# Patient Record
Sex: Female | Born: 1990 | Race: White | Hispanic: No | Marital: Married | Smoking: Never smoker
Health system: Southern US, Community
[De-identification: ages and names within clinical notes are randomized; demographics above are authoritative.]

## PROBLEM LIST (undated history)

## (undated) DIAGNOSIS — T7840XA Allergy, unspecified, initial encounter: Secondary | ICD-10-CM

## (undated) DIAGNOSIS — G8929 Other chronic pain: Secondary | ICD-10-CM

## (undated) DIAGNOSIS — F4310 Post-traumatic stress disorder, unspecified: Secondary | ICD-10-CM

## (undated) DIAGNOSIS — J45909 Unspecified asthma, uncomplicated: Secondary | ICD-10-CM

## (undated) DIAGNOSIS — B019 Varicella without complication: Secondary | ICD-10-CM

## (undated) DIAGNOSIS — F419 Anxiety disorder, unspecified: Secondary | ICD-10-CM

## (undated) HISTORY — DX: Varicella without complication: B01.9

## (undated) HISTORY — DX: Allergy, unspecified, initial encounter: T78.40XA

## (undated) HISTORY — PX: FRACTURE SURGERY: SHX138

## (undated) HISTORY — DX: Other chronic pain: G89.29

## (undated) HISTORY — PX: TONSILLECTOMY: SUR1361

## (undated) HISTORY — DX: Post-traumatic stress disorder, unspecified: F43.10

## (undated) HISTORY — DX: Unspecified asthma, uncomplicated: J45.909

## (undated) HISTORY — DX: Anxiety disorder, unspecified: F41.9

---
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
## 2010-11-21 ENCOUNTER — Emergency Department (HOSPITAL_COMMUNITY)
Admission: EM | Admit: 2010-11-21 | Discharge: 2010-11-21 | Disposition: A | Discharge status: Home / Self Care | Payer: 59 | Attending: Emergency Medicine | Admitting: Emergency Medicine

## 2010-11-21 ENCOUNTER — Emergency Department (HOSPITAL_COMMUNITY): Payer: 59

## 2010-11-21 DIAGNOSIS — M79609 Pain in unspecified limb: Secondary | ICD-10-CM | POA: Insufficient documentation

## 2010-11-21 DIAGNOSIS — R0789 Other chest pain: Secondary | ICD-10-CM | POA: Insufficient documentation

## 2010-11-21 DIAGNOSIS — R0609 Other forms of dyspnea: Secondary | ICD-10-CM | POA: Insufficient documentation

## 2010-11-21 DIAGNOSIS — R0989 Other specified symptoms and signs involving the circulatory and respiratory systems: Secondary | ICD-10-CM | POA: Insufficient documentation

## 2010-11-21 DIAGNOSIS — R002 Palpitations: Secondary | ICD-10-CM | POA: Insufficient documentation

## 2010-11-21 LAB — POCT I-STAT, CHEM 8
BUN: 10 mg/dL (ref 6–23)
Calcium, Ion: 1.21 mmol/L (ref 1.12–1.32)
Chloride: 104 mEq/L (ref 96–112)
Creatinine, Ser: 0.9 mg/dL (ref 0.4–1.2)
Sodium: 139 mEq/L (ref 135–145)
TCO2: 26 mmol/L (ref 0–100)

## 2010-11-21 LAB — D-DIMER, QUANTITATIVE: D-Dimer, Quant: <0.22 ug/mL-FEU (ref 0.00–0.48)

## 2012-01-01 ENCOUNTER — Ambulatory Visit (INDEPENDENT_AMBULATORY_CARE_PROVIDER_SITE_OTHER): Payer: 59 | Admitting: Family Medicine

## 2012-01-01 VITALS — BP 120/73 | HR 83 | Temp 98.2°F | Resp 16 | Ht 67.0 in | Wt 155.0 lb

## 2012-01-01 DIAGNOSIS — J019 Acute sinusitis, unspecified: Secondary | ICD-10-CM

## 2012-01-01 MED ORDER — FLUTICASONE PROPIONATE 50 MCG/ACT NA SUSP: 2.0000 | Freq: Every day | NASAL | Status: DC

## 2012-01-01 MED ORDER — AMOXICILLIN 875 MG PO TABS: 875.0000 mg | ORAL_TABLET | Freq: Two times a day (BID) | ORAL | Status: AC

## 2012-01-01 NOTE — Progress Notes (Signed)
  Subjective:    Patient ID: Ashley Bond, female    DOB: 13-Oct-1991, 21 y.o.   MRN: [id number]  HPI 21 yo female with URI symptoms for 4 days.  Head congestion, nasal drainage, PND, sore throa, slight cough, headache.  Decreased hearing in right ear.  Subjective fever Saturday. Tried nyquil and ibuprofen.   Congestion making it hard to sleep.    Review of Systems Negative except as per HPI     Objective:   Physical Exam  Constitutional: She appears well-developed. No distress.  HENT:  Right Ear: Tympanic membrane, external ear and ear canal normal. Tympanic membrane is not injected, not scarred, not perforated, not erythematous, not retracted and not bulging.  Left Ear: Tympanic membrane, external ear and ear canal normal. Tympanic membrane is not injected, not scarred, not perforated, not erythematous, not retracted and not bulging.  Nose: Mucosal edema present. No rhinorrhea. Right sinus exhibits no maxillary sinus tenderness and no frontal sinus tenderness. Left sinus exhibits maxillary sinus tenderness. Left sinus exhibits no frontal sinus tenderness.  Mouth/Throat: Uvula is midline, oropharynx is clear and moist and mucous membranes are normal. No oropharyngeal exudate or tonsillar abscesses.  Cardiovascular: Normal rate, regular rhythm, normal heart sounds and intact distal pulses.   No murmur heard. Pulmonary/Chest: Effort normal and breath sounds normal. No respiratory distress. She has no wheezes. She has no rales.  Lymphadenopathy:       Head (right side): No submandibular and no preauricular adenopathy present.       Head (left side): No submandibular and no preauricular adenopathy present.    She has cervical adenopathy.       Right cervical: No superficial cervical and no posterior cervical adenopathy present.      Left cervical: Superficial cervical adenopathy present. No posterior cervical adenopathy present.       Right: No supraclavicular adenopathy present.   Left: No supraclavicular adenopathy present.  Skin: Skin is warm and dry.          Assessment & Plan:  Sinus infection - amox and flonase

## 2012-04-15 ENCOUNTER — Ambulatory Visit (INDEPENDENT_AMBULATORY_CARE_PROVIDER_SITE_OTHER): Payer: 59 | Admitting: Internal Medicine

## 2012-04-15 VITALS — BP 128/86 | HR 74 | Temp 97.9°F | Resp 16 | Ht 65.75 in | Wt 157.8 lb

## 2012-04-15 DIAGNOSIS — L408 Other psoriasis: Secondary | ICD-10-CM

## 2012-04-15 DIAGNOSIS — R350 Frequency of micturition: Secondary | ICD-10-CM

## 2012-04-15 DIAGNOSIS — G4710 Hypersomnia, unspecified: Secondary | ICD-10-CM

## 2012-04-15 DIAGNOSIS — G4700 Insomnia, unspecified: Secondary | ICD-10-CM

## 2012-04-15 DIAGNOSIS — R5383 Other fatigue: Secondary | ICD-10-CM

## 2012-04-15 DIAGNOSIS — L409 Psoriasis, unspecified: Secondary | ICD-10-CM

## 2012-04-15 DIAGNOSIS — R5381 Other malaise: Secondary | ICD-10-CM

## 2012-04-15 LAB — POCT URINALYSIS DIPSTICK
Bilirubin, UA: NEGATIVE
Glucose, UA: NEGATIVE
Ketones, UA: NEGATIVE
Nitrite, UA: NEGATIVE
Protein, UA: NEGATIVE
Spec Grav, UA: <=1.005
Urobilinogen, UA: 0.2
pH, UA: 6.5

## 2012-04-15 LAB — POCT UA - MICROSCOPIC ONLY
Bacteria, U Microscopic: NEGATIVE
Casts, Ur, LPF, POC: NEGATIVE
Crystals, Ur, HPF, POC: NEGATIVE
Epithelial cells, urine per micros: NEGATIVE
Mucus, UA: NEGATIVE
Yeast, UA: NEGATIVE

## 2012-04-15 LAB — POCT CBC
Granulocyte percent: 58.9 %G (ref 37–80)
Lymph, poc: 2.7 (ref 0.6–3.4)
MCHC: 30.4 g/dL — AB (ref 31.8–35.4)
MID (cbc): 0.3 (ref 0–0.9)
MPV: 8.1 fL (ref 0–99.8)
POC Granulocyte: 4.3 (ref 2–6.9)
POC LYMPH PERCENT: 36.9 %L (ref 10–50)
POC MID %: 4.2 %M (ref 0–12)
Platelet Count, POC: 414 10*3/uL (ref 142–424)
RDW, POC: 13.7 %

## 2012-04-15 LAB — COMPREHENSIVE METABOLIC PANEL
ALT: 22 U/L (ref 0–35)
Alkaline Phosphatase: 67 U/L (ref 39–117)
CO2: 27 mEq/L (ref 19–32)
Creat: 0.69 mg/dL (ref 0.50–1.10)
Sodium: 139 mEq/L (ref 135–145)
Total Bilirubin: 0.3 mg/dL (ref 0.3–1.2)
Total Protein: 6.9 g/dL (ref 6.0–8.3)

## 2012-04-15 LAB — TSH: TSH: 0.338 u[IU]/mL — ABNORMAL LOW (ref 0.350–4.500)

## 2012-04-15 LAB — POCT SEDIMENTATION RATE: POCT SED RATE: 15 mm/hr (ref 0–22)

## 2012-04-15 LAB — T4, FREE: Free T4: 1.22 ng/dL (ref 0.80–1.80)

## 2012-04-15 MED ORDER — CLOBETASOL PROPIONATE 0.05 % EX SOLN: CUTANEOUS | Status: DC

## 2012-04-15 MED ORDER — FLUOCINONIDE-E 0.05 % EX CREA: TOPICAL_CREAM | Freq: Two times a day (BID) | CUTANEOUS | Status: AC

## 2012-04-15 NOTE — Progress Notes (Signed)
Subjective:    Patient ID: Ashley Bond, female    DOB: 13-Jun-1991, 21 y.o.   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 but still feels unrested when wakes/no snoring but can't reach her nose at night and wakes gasping for air/family history strong for sleep apnea/has been evaluated for allergies and put on Flonase and Zyrtec but so far this hasn't helped sleep/primary care and the research triangle He admits lots of anxiety and worrying about everything when she is stressed by academics/stays awake thinking about what she might have forgotten to do Panic attacks when younger No current daytime anxiety, social anxiety, depression  No dysuria urgency small void hematuria No vaginal discharge or dyspareunia Menses regular/Nutrigrain/1 partner 2-1/2 years without problems   Psoriasis-Long history/mild Diet good/exercise good    Review of SystemsBesides the present history she has ongoing problems with back pain related to exercise better muscular in nature and intermittent She had neck pain  during the school year it was probably related to caring heavy equipment GI symptoms negative NMBJ Lots of stiffness but no swollen joints Neuro neuro negative/Although history of chronic headaches elicited October 2013 She has exercise-induced asthma and significant seasonal allergens Uses melatonin and sleepy time tea without much success Complete physical exam October 2013 in chart    Objective:   Physical Exam  Vital signs stable except for 157 pounds In no acute distress HEENT clear including no thyromegaly Heart regular without murmur Lungs clear including forced expiration Abdomen soft  with no organomegaly or masses Extremities clear Straight-leg raise negative to 90/back nontender to palpation Neck good range of motion Skin with mild eczema changes over her elbows and left side of occiput Neuro intact      Results for orders placed in visit on 04/15/12  POCT UA - MICROSCOPIC ONLY      Component Value Range   WBC, Ur, HPF, POC 0-1     RBC, urine, microscopic 0-1     Bacteria, U Microscopic neg     Mucus, UA neg     Epithelial cells, urine per micros neg     Crystals, Ur, HPF, POC neg     Casts, Ur, LPF, POC neg     Yeast, UA neg    POCT URINALYSIS DIPSTICK      Component Value Range   Color, UA yellow     Clarity, UA hazy     Glucose, UA neg     Bilirubin, UA neg     Ketones, UA neg     Spec Grav, UA <=1.005     Blood, UA trace     pH, UA 6.5     Protein, UA neg     Urobilinogen, UA 0.2     Nitrite, UA neg     Leukocytes, UA Trace    POCT CBC      Component Value Range   WBC 7.3  4.6 - 10.2 K/uL   Lymph, poc 2.7  0.6 - 3.4   POC LYMPH PERCENT 36.9  10 - 50 %L   MID (cbc) 0.3  0 - 0.9   POC MID % 4.2  0 - 12 %M   POC Granulocyte 4.3  2 - 6.9   Granulocyte percent 58.9  37 -  80 %G   RBC 4.82  4.04 - 5.48 M/uL   Hemoglobin 13.3  12.2 - 16.2 g/dL   HCT, POC 40.9  81.1 - 47.9 %   MCV 90.9  80 - 97 fL   MCH, POC 27.6  27 - 31.2 pg   MCHC 30.4 (*) 31.8 - 35.4 g/dL   RDW, POC 91.4     Platelet Count, POC 414  142 - 424 K/uL   MPV 8.1  0 - 99.8 fL     Assessment & Plan:   1. Urinary frequency  POCT UA - Microscopic Only, POCT urinalysis dipstick, Urine culture  2. Psoriasis  fluocinonide-emollient (LIDEX-E) 0.05 % cream  3. Fatigue  POCT CBC, POCT SEDIMENTATION RATE, Comprehensive metabolic panel, TSH, T4, Free  4. Hypersomnolence  Ambulatory referral to Sleep Studies  5. Insomnia  Ambulatory referral to Sleep Studies   Meds ordered this encounter  Medications  .       . fluocinonide-emollient (LIDEX-E) 0.05 % cream    Sig: Apply  topically 2 (two) times daily.    Dispense:  30 g    Refill:  3  . clobetasol (TEMOVATE) 0.05 % external solution    Sig: Apply to scalp twice a day as needed    Dispense:  50 mL    Refill:  1   Notify labs

## 2012-04-17 ENCOUNTER — Telehealth: Payer: Self-pay

## 2012-04-17 ENCOUNTER — Encounter: Payer: Self-pay | Admitting: Internal Medicine

## 2012-04-17 NOTE — Telephone Encounter (Signed)
Please contact pt about lab resulsts [phone]

## 2012-04-17 NOTE — Telephone Encounter (Signed)
Spoke with patient and let her know that Dr. Merla Riches will review the lab results and then we will call her.  She understood and said that was fine.

## 2012-04-20 NOTE — Telephone Encounter (Signed)
Pt called wanting to know lab results. Please call back at  [phone]

## 2012-04-21 MED ORDER — AMOXICILLIN 500 MG PO CAPS: 500.0000 mg | ORAL_CAPSULE | Freq: Three times a day (TID) | ORAL | Status: AC

## 2012-04-21 NOTE — Telephone Encounter (Signed)
Labs were all ok except urine showed mild infection.  Was she treated with antibiotics.

## 2012-04-21 NOTE — Telephone Encounter (Signed)
LMOM TO CB 

## 2012-04-21 NOTE — Telephone Encounter (Signed)
lmom that rx was sent in

## 2012-04-21 NOTE — Telephone Encounter (Signed)
CAN YOU PLEASE REVIEW LABS

## 2012-04-21 NOTE — Telephone Encounter (Signed)
Prescription was sent to the pharmacy

## 2012-04-21 NOTE — Telephone Encounter (Signed)
ADVISED PT OF RESULTS AND SHE WAS NOT GIVEN AN ANTIBIOTIC BUT SHE WOULD LIKE TO HAVE ONE SENT IN.  PHARMACY [facility] [place] Ashley Bond

## 2012-05-25 ENCOUNTER — Encounter (HOSPITAL_COMMUNITY): Payer: Self-pay | Admitting: Emergency Medicine

## 2012-05-25 ENCOUNTER — Emergency Department (HOSPITAL_COMMUNITY)
Admission: EM | Admit: 2012-05-25 | Discharge: 2012-05-26 | Disposition: A | Discharge status: Home / Self Care | Payer: 59 | Attending: Emergency Medicine | Admitting: Emergency Medicine

## 2012-05-25 DIAGNOSIS — F17200 Nicotine dependence, unspecified, uncomplicated: Secondary | ICD-10-CM | POA: Insufficient documentation

## 2012-05-25 DIAGNOSIS — R450 Nervousness: Secondary | ICD-10-CM | POA: Insufficient documentation

## 2012-05-25 DIAGNOSIS — R5381 Other malaise: Secondary | ICD-10-CM | POA: Insufficient documentation

## 2012-05-25 DIAGNOSIS — R531 Weakness: Secondary | ICD-10-CM

## 2012-05-25 LAB — COMPREHENSIVE METABOLIC PANEL
ALT: 12 U/L (ref 0–35)
AST: 17 U/L (ref 0–37)
Albumin: 3.7 g/dL (ref 3.5–5.2)
Alkaline Phosphatase: 56 U/L (ref 39–117)
CO2: 26 mEq/L (ref 19–32)
Chloride: 100 mEq/L (ref 96–112)
Potassium: 3.6 mEq/L (ref 3.5–5.1)
Total Bilirubin: 0.2 mg/dL — ABNORMAL LOW (ref 0.3–1.2)

## 2012-05-25 LAB — CBC WITH DIFFERENTIAL/PLATELET
Basophils Absolute: 0 10*3/uL (ref 0.0–0.1)
Basophils Relative: 0 % (ref 0–1)
Hemoglobin: 11.6 g/dL — ABNORMAL LOW (ref 12.0–15.0)
Lymphocytes Relative: 44 % (ref 12–46)
MCHC: 34 g/dL (ref 30.0–36.0)
Neutro Abs: 2.9 10*3/uL (ref 1.7–7.7)
Neutrophils Relative %: 46 % (ref 43–77)
RDW: 12.4 % (ref 11.5–15.5)
WBC: 6.4 10*3/uL (ref 4.0–10.5)

## 2012-05-25 LAB — URINALYSIS, ROUTINE W REFLEX MICROSCOPIC
Glucose, UA: NEGATIVE mg/dL
Ketones, ur: NEGATIVE mg/dL
Leukocytes, UA: NEGATIVE
Nitrite: NEGATIVE
Protein, ur: NEGATIVE mg/dL
pH: 6 (ref 5.0–8.0)

## 2012-05-25 MED ORDER — ASPIRIN 81 MG PO CHEW
CHEWABLE_TABLET | ORAL | Status: AC
  Administered 2012-05-25: 324 mg
  Filled 2012-05-25: qty 4

## 2012-05-25 NOTE — ED Notes (Signed)
EKG printed and given to EDP Devoria Albe for review. No previous EKG available for comparison.

## 2012-05-25 NOTE — ED Notes (Signed)
Pt alert, arrives from home, c/o weakness, "shakiness", onset a few days ago, recently treated for UTI, resp even unlabored, skin pwd

## 2012-05-25 NOTE — ED Provider Notes (Signed)
History     CSN: [id number]  Arrival date & time 05/25/12  1901   First MD Initiated Contact with Patient 05/25/12 2048      Chief Complaint  Patient presents with  . Weakness    (Consider location/radiation/quality/duration/timing/severity/associated sxs/prior treatment) HPI  Patient here for feeling shaky for the past few days. She also states today she feels weak and feels like she is "jumping out of my skin" she reports she doesn't have anything to be nervous about. 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 She states that she has had problems in the past with not sleeping at night and she has cut out her caffeine which helped for a while. She states she has extreme nasal congestion and has lots of sneezing, and she has a mild sore throat. She denies fever, wheezing, diarrhea, nausea or vomiting. She states she feels short of breath that is because she cannot breathe through her nose at all. She relates she takes Benadryl sometimes at night for the past few months. She also took some Alka-Seltzer and drank a Dr. Reino Kent at night this week which seemed to make her feel more jittery. She relates she saw allergist many years ago and did allergy shots for short period time which did not seem to help. She has been evaluated before and was referred to get a sleep study study to evaluate her for sleep apnea which she did not followup with.  PCP none  History reviewed. No pertinent past medical history.  Past Surgical History  Procedure Date  . Tonsillectomy     No family history on file.  History  Substance Use Topics  . Smoking status: Current Some Day Smoker -- 1.0 packs/day    Types: Cigarettes  . Smokeless tobacco: Not on file  . 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    Allergies  Shellfish allergy; Pseudoephedrine hcl er; and Soy allergy  Home Medications   Current Outpatient Rx  Name Route Sig Dispense Refill  . CLOBETASOL PROPIONATE 0.05 % EX SOLN  Apply to scalp twice a day as needed 50 mL 1  . DIPHENHYDRAMINE HCL 25 MG PO CAPS Oral Take 25 mg by mouth every 6 (six) hours as needed.    Marland Kitchen EPINEPHRINE 0.3 MG/0.3ML IJ DEVI Intramuscular Inject 0.3 mg into the muscle once.    . ETONOGESTREL-ETHINYL ESTRADIOL 0.12-0.015 MG/24HR VA RING Vaginal Place 1 each vaginally every 28 (twenty-eight) days. Insert vaginally and leave in place for 3 consecutive weeks, then remove for 1 week.    Marland Kitchen FLUOCINONIDE-E 0.05 % EX CREA Topical Apply topically 2 (two) times daily. 30 g 3  . IBUPROFEN 200 MG PO TABS Oral Take 200 mg by mouth every 6 (six) hours as needed.    Marland Kitchen MONTELUKAST SODIUM 10 MG PO TABS Oral Take 10 mg by mouth at bedtime.      BP 119/75  Pulse 76  Temp 98.5 F (36.9 C) (Oral)  Resp 12  Ht 5\' 6"  (1.676 m)  Wt 155 lb (70.308 kg)  BMI 25.02 kg/m2  SpO2 99%  LMP 05/15/2012  Vital signs  normal   Orthostatic vital signs were normal   Physical Exam  Nursing note and vitals reviewed. Constitutional: She is oriented to person, place, and time. She appears well-developed and well-nourished.  Non-toxic appearance. She does not appear ill. No distress.  HENT:  Head: Normocephalic and atraumatic.  Right Ear: External ear normal.  Left Ear: External ear normal.  Nose: Nose normal. No mucosal edema or rhinorrhea.  Mouth/Throat: Oropharynx is clear and moist and mucous membranes are normal. No dental abscesses or uvula swelling.       Inspection of her nose shows marked diffuse erythema and bogginess bilaterally of her turbinates  Eyes: Conjunctivae and EOM are normal. Pupils are equal, round, and reactive to light.  Neck: Normal range of motion and full passive range of motion without pain. Neck supple.  No thyromegaly present.  Cardiovascular: Normal rate, regular rhythm, normal heart sounds and intact distal pulses.  Exam reveals no gallop and no friction rub.   No murmur heard. Pulmonary/Chest: Effort normal and breath sounds normal. No respiratory distress. She has no wheezes. She has no rhonchi. She has no rales. She exhibits no tenderness and no crepitus.  Abdominal: Soft. Normal appearance and bowel sounds are normal. She exhibits no distension. There is no tenderness. There is no rebound and no guarding.  Musculoskeletal: Normal range of motion. She exhibits no edema and no tenderness.       Moves all extremities well.   Neurological: She is alert and oriented to person, place, and time. She has normal strength. No cranial nerve deficit.  Skin: Skin is warm, dry and intact. No rash noted. No erythema. No pallor.  Psychiatric: She has a normal mood and affect. Her speech is normal and behavior is normal. Her mood appears not anxious.    ED Course  Procedures (including critical care time)   Patient received 1 L of IV fluids. She relates she's feeling better. Patient advised to followup with ENT to evaluate her nasal congestion and possible polyps, she also can followup at [facility] urgent care to get the results of her thyroid tests which were pending at time of discharge.   Results for orders placed during the hospital encounter of 05/25/12  URINALYSIS, ROUTINE W REFLEX MICROSCOPIC      Component Value Range   Color, Urine YELLOW  YELLOW   APPearance CLEAR  CLEAR   Specific Gravity, Urine 1.007  1.005 - 1.030   pH 6.0  5.0 - 8.0   Glucose, UA NEGATIVE  NEGATIVE mg/dL   Hgb urine dipstick NEGATIVE  NEGATIVE   Bilirubin Urine NEGATIVE  NEGATIVE   Ketones, ur NEGATIVE  NEGATIVE mg/dL   Protein, ur NEGATIVE  NEGATIVE mg/dL   Urobilinogen, UA 0.2  0.0 - 1.0 mg/dL   Nitrite NEGATIVE  NEGATIVE   Leukocytes, UA NEGATIVE  NEGATIVE  CBC WITH DIFFERENTIAL      Component Value Range     WBC 6.4  4.0 - 10.5 K/uL   RBC 4.11  3.87 - 5.11 MIL/uL   Hemoglobin 11.6 (*) 12.0 - 15.0 g/dL   HCT 16.1 (*) 09.6 - 04.5 %   MCV 83.0  78.0 - 100.0 fL   MCH 28.2  26.0 - 34.0 pg   MCHC 34.0  30.0 - 36.0 g/dL   RDW 40.9  81.1 - 91.4 %   Platelets 279  150 - 400 K/uL   Neutrophils Relative 46  43 - 77 %   Neutro Abs 2.9  1.7 -  7.7 K/uL   Lymphocytes Relative 44  12 - 46 %   Lymphs Abs 2.8  0.7 - 4.0 K/uL   Monocytes Relative 8  3 - 12 %   Monocytes Absolute 0.5  0.1 - 1.0 K/uL   Eosinophils Relative 2  0 - 5 %   Eosinophils Absolute 0.2  0.0 - 0.7 K/uL   Basophils Relative 0  0 - 1 %   Basophils Absolute 0.0  0.0 - 0.1 K/uL  COMPREHENSIVE METABOLIC PANEL      Component Value Range   Sodium 134 (*) 135 - 145 mEq/L   Potassium 3.6  3.5 - 5.1 mEq/L   Chloride 100  96 - 112 mEq/L   CO2 26  19 - 32 mEq/L   Glucose, Bld 100 (*) 70 - 99 mg/dL   BUN 11  6 - 23 mg/dL   Creatinine, Ser 1.61  0.50 - 1.10 mg/dL   Calcium 9.3  8.4 - 09.6 mg/dL   Total Protein 6.9  6.0 - 8.3 g/dL   Albumin 3.7  3.5 - 5.2 g/dL   AST 17  0 - 37 U/L   ALT 12  0 - 35 U/L   Alkaline Phosphatase 56  39 - 117 U/L   Total Bilirubin 0.2 (*) 0.3 - 1.2 mg/dL   GFR calc non Af Amer >90  >90 mL/min   GFR calc Af Amer >90  >90 mL/min   Laboratory interpretation all normal .   Date: 05/25/2012  Rate: 79  Rhythm: normal sinus rhythm  QRS Axis: normal  Intervals: normal  ST/T Wave abnormalities: nonspecific T wave changes  Conduction Disutrbances:none  Narrative Interpretation:   Old EKG Reviewed: none available     1. Weakness   2. Jittery     Plan discharge  Devoria Albe, MD, FACEP   MDM          Ward Givens, MD 05/25/12 [phone]

## 2012-06-19 ENCOUNTER — Encounter: Payer: Self-pay | Admitting: Family Medicine

## 2012-06-19 ENCOUNTER — Ambulatory Visit (INDEPENDENT_AMBULATORY_CARE_PROVIDER_SITE_OTHER): Payer: 59 | Admitting: Family Medicine

## 2012-06-19 VITALS — BP 117/74 | HR 80 | Temp 98.2°F | Ht 66.75 in | Wt 155.0 lb

## 2012-06-19 DIAGNOSIS — Z13220 Encounter for screening for lipoid disorders: Secondary | ICD-10-CM

## 2012-06-19 DIAGNOSIS — Z0000 Encounter for general adult medical examination without abnormal findings: Secondary | ICD-10-CM | POA: Insufficient documentation

## 2012-06-19 LAB — LIPID PANEL
HDL: 118.9 mg/dL (ref >39.00)
Total CHOL/HDL Ratio: 2
VLDL: 14.2 mg/dL (ref 0.0–40.0)

## 2012-06-19 NOTE — Patient Instructions (Addendum)
Follow up in 1 year or as needed We'll notify you of your lab results and make any changes if needed Think of [place] as your home base- we're here if you need [place]! Keep up the good work!  You look great! Good luck w/ school!!!

## 2012-06-19 NOTE — Assessment & Plan Note (Signed)
Pt's PE WNL.  Had labs done recently in ER.  Will check lipids and Vit D as this was not done.  UTD on GYN.  Anticipatory guidance provided.

## 2012-06-19 NOTE — Progress Notes (Signed)
  Subjective:    Patient ID: Ashley Bond, female    DOB: October 01, 1991, 21 y.o.   MRN: [id number]  HPI New to establish.  Has been local x3 yrs but no PCP- using UCs as needed.  GYN in [place].  Desires CPE, no concerns today.   Review of Systems Patient reports no vision/ hearing changes, adenopathy,fever, weight change,  persistant/recurrent hoarseness , swallowing issues, chest pain, palpitations, edema, persistant/recurrent cough, hemoptysis, dyspnea (rest/exertional/paroxysmal nocturnal), gastrointestinal bleeding (melena, rectal bleeding), abdominal pain, significant heartburn, bowel changes, GU symptoms (dysuria, hematuria, incontinence), Gyn symptoms (abnormal  bleeding, pain),  syncope, focal weakness, memory loss, numbness & tingling, skin/hair/nail changes, abnormal bruising or bleeding.     Objective:   Physical Exam General Appearance:    Alert, cooperative, no distress, appears stated age  Head:    Normocephalic, without obvious abnormality, atraumatic  Eyes:    PERRL, conjunctiva/corneas clear, EOM's intact, fundi    benign, both eyes  Ears:    Normal TM's and external ear canals, both ears  Nose:   Nares normal, septum midline, mucosa normal, no drainage    or sinus tenderness  Throat:   Lips, mucosa, and tongue normal; teeth and gums normal  Neck:   Supple, symmetrical, trachea midline, no adenopathy;    Thyroid: no enlargement/tenderness/nodules  Back:     Symmetric, no curvature, ROM normal, no CVA tenderness  Lungs:     Clear to auscultation bilaterally, respirations unlabored  Chest Wall:    No tenderness or deformity   Heart:    Regular rate and rhythm, S1 and S2 normal, no murmur, rub   or gallop  Breast Exam:    Deferred to GYN  Abdomen:     Soft, non-tender, bowel sounds active all four quadrants,    no masses, no organomegaly  Genitalia:    Deferred to GYN  Rectal:    Extremities:   Extremities normal, atraumatic, no cyanosis or edema  Pulses:   2+ and  symmetric all extremities  Skin:   Skin color, texture, turgor normal, no rashes or lesions  Lymph nodes:   Cervical, supraclavicular, and axillary nodes normal  Neurologic:   CNII-XII intact, normal strength, sensation and reflexes    throughout          Assessment & Plan:

## 2012-06-20 ENCOUNTER — Encounter: Payer: Self-pay | Admitting: *Deleted

## 2012-06-22 LAB — VITAMIN D 1,25 DIHYDROXY
Vitamin D 1, 25 (OH)2 Total: 61 pg/mL (ref 18–72)
Vitamin D3 1, 25 (OH)2: 61 pg/mL

## 2012-06-24 ENCOUNTER — Encounter: Payer: Self-pay | Admitting: *Deleted

## 2012-07-24 ENCOUNTER — Ambulatory Visit: Payer: 59 | Admitting: Family Medicine

## 2012-11-22 ENCOUNTER — Ambulatory Visit: Payer: 59 | Admitting: Family Medicine

## 2012-11-22 DIAGNOSIS — Z0289 Encounter for other administrative examinations: Secondary | ICD-10-CM

## 2012-11-27 ENCOUNTER — Ambulatory Visit (INDEPENDENT_AMBULATORY_CARE_PROVIDER_SITE_OTHER): Payer: 59 | Admitting: Physician Assistant

## 2012-11-27 VITALS — BP 125/79 | HR 84 | Temp 97.9°F | Resp 18 | Ht 66.5 in | Wt 166.0 lb

## 2012-11-27 DIAGNOSIS — R05 Cough: Secondary | ICD-10-CM

## 2012-11-27 DIAGNOSIS — R059 Cough, unspecified: Secondary | ICD-10-CM

## 2012-11-27 DIAGNOSIS — J309 Allergic rhinitis, unspecified: Secondary | ICD-10-CM

## 2012-11-27 DIAGNOSIS — J019 Acute sinusitis, unspecified: Secondary | ICD-10-CM

## 2012-11-27 MED ORDER — IPRATROPIUM BROMIDE 0.06 % NA SOLN: 2.0000 | Freq: Three times a day (TID) | NASAL | Status: DC

## 2012-11-27 MED ORDER — BENZONATATE 200 MG PO CAPS: 200.0000 mg | ORAL_CAPSULE | Freq: Two times a day (BID) | ORAL | Status: DC | PRN

## 2012-11-27 MED ORDER — AZELASTINE HCL 0.15 % NA SOLN: 2.0000 | Freq: Two times a day (BID) | NASAL | Status: DC

## 2012-11-27 MED ORDER — AMOXICILLIN-POT CLAVULANATE 875-125 MG PO TABS: 1.0000 | ORAL_TABLET | Freq: Two times a day (BID) | ORAL | Status: DC

## 2012-11-27 NOTE — Progress Notes (Signed)
Patient ID: Ashley Bond MRN: [id number], DOB: May 03, 1991, 22 y.o. Date of Encounter: 11/27/2012, 1:07 PM  Primary Physician: Neena Rhymes, MD  Chief Complaint:  Chief Complaint  Patient presents with  . Sinus Problem    congestion x 2 weeks    HPI: 22 y.o. year old female presents with a 2 week history of nasal congestion, post nasal drip, sinus pressure, and cough. Afebrile. No chills. Nasal congestion thick and green/yellow. Sinus pressure is the worst symptom. Cough is productive secondary to post nasal drip and not associated with time of day. Ears feel full, leading to sensation of muffled hearing. Has tried OTC cold preps without success. No GI complaints. Appetite normal. No recent antibiotics, recent travels, or sick contacts. No leg trauma, sedentary periods, or h/o cancer. Some tobacco use.   She also mentions a long standing history of allergies. She has previously tried Nasonex and Singulair without good success. She currently resides in an older apartment and uses the air filters they supply.   Past Medical History  Diagnosis Date  . Asthma   . Chicken pox   . Anxiety   . Allergy      Home Meds: Prior to Admission medications   Medication Sig Start Date End Date Taking? Authorizing Provider  clobetasol (TEMOVATE) 0.05 % external solution Apply to scalp twice a day as needed 04/15/12  Yes Tonye Pearson, MD  EPINEPHrine (EPIPEN) 0.3 mg/0.3 mL DEVI Inject 0.3 mg into the muscle once.   Yes Historical Provider, MD  etonogestrel-ethinyl estradiol (NUVARING) 0.12-0.015 MG/24HR vaginal ring Place 1 each vaginally every 28 (twenty-eight) days. Insert vaginally and leave in place for 3 consecutive weeks, then remove for 1 week.   Yes Historical Provider, MD  FLUoxetine (PROZAC) 10 MG capsule Take 10 mg by mouth daily.   Yes Historical Provider, MD  diphenhydrAMINE (BENADRYL) 25 mg capsule Take 25 mg by mouth every 6 (six) hours as needed.   No Historical Provider, MD   fluocinonide-emollient (LIDEX-E) 0.05 % cream Apply topically 2 (two) times daily. 04/15/12 04/15/13 No Tonye Pearson, MD  ibuprofen (ADVIL,MOTRIN) 200 MG tablet Take 200 mg by mouth every 6 (six) hours as needed.   No Historical Provider, MD  montelukast (SINGULAIR) 10 MG tablet Take 10 mg by mouth at bedtime.   No Historical Provider, MD    Allergies:  Allergies  Allergen Reactions  . Shellfish Allergy   . Pseudoephedrine Hcl Er   . Soy Allergy     History   Social History  . Marital Status: Single    Spouse Name: N/A    Number of Children: N/A  . Years of Education: N/A   Occupational History  . Not on file.   Social History Main Topics  . Smoking status: Current Some Day Smoker -- 1.00 packs/day    Types: Cigarettes  . Smokeless tobacco: Not on file  . Alcohol Use: No  . Drug Use: No  . Sexually Active: Yes    Birth Control/ Protection: Inserts   Other Topics Concern  . Not on file   Social History Narrative  . No narrative on file     Review of Systems: Constitutional: Positive for fatigue. Negative for chills, fever, night sweats or weight changes HEENT: see above Cardiovascular: negative for chest pain or palpitations Respiratory: Positive for cough. Negative for hemoptysis, wheezing, or shortness of breath Abdominal: negative for abdominal pain, nausea, vomiting or diarrhea Dermatological: negative for rash Neurologic: Positive for headache. Negative  for dizziness or vertigo   Physical Exam: Blood pressure 125/79, pulse 84, temperature 97.9 F (36.6 C), temperature source Oral, resp. rate 18, height 5' 6.5" (1.689 m), weight 166 lb (75.297 kg), last menstrual period 11/12/2012, SpO2 97.00%., Body mass index is 26.39 kg/(m^2). General: Well developed, well nourished, in no acute distress. Head: Normocephalic, atraumatic, eyes without discharge, sclera non-icteric, nares are congested. Bilateral auditory canals clear, TM's are without perforation, pearly  grey with reflective cone of light bilaterally. Serous effusion bilaterally behind TM's. Maxillary and frontal sinus TTP. Oral cavity moist, dentition normal. Posterior pharynx with post nasal drip and mild erythema. No peritonsillar abscess or tonsillar exudate. Uvula midline. Neck: Supple. No thyromegaly. Full ROM. No lymphadenopathy. Lungs: Clear bilaterally to auscultation without wheezes, rales, or rhonchi. Breathing is unlabored.  Heart: RRR with S1 S2. No murmurs, rubs, or gallops appreciated. Msk:  Strength and tone normal for age. Extremities: No clubbing or cyanosis. No edema. Neuro: Alert and oriented X 3. Moves all extremities spontaneously. CNII-XII grossly in tact. Psych:  Responds to questions appropriately with a normal affect.     ASSESSMENT AND PLAN:  22 y.o. female with sinusitis and history of allergic rhinitis  22 y.o. female with sinusitis 875/125 mg 1 po bid #20 no RF -Atrovent NS 0.06% 2 sprays each nare bid prn #1 no RF -Tessalon Perles 200 mg 1 po tid prn cough #30 no RF  -Mucinex -Tylenol/Motrin prn -Rest/fluids -RTC precautions -RTC 3-5 days if no improvement  2) History of allergic rhinitis -Astepro 0.15% 2 sprays each nare bid #30 mL RF 6 -Daily Zyrtec -One teaspoon of local honey daily -Cover for box spring and mattress -Use better air filters -Routine house cleaning   Signed, Eula Listen, PA-C 11/27/2012 1:07 PM

## 2013-01-09 ENCOUNTER — Other Ambulatory Visit: Payer: Self-pay | Admitting: Physician Assistant

## 2013-01-09 NOTE — Telephone Encounter (Signed)
PATIENT IS STILL HAVING THE SAME SYMPTOMS OF SINUS PRESSURE. WAS GIVEN AMOXICILLNA CLAVULNATE AND WANTS TO KNOW IF SHE SHOULD COME BACK IN OR CAN HER RX BE REFILLED. PATIENT STATES SHE HAS ALREADY CALLED HER PHARMACY AND THEY FAXED SOMETHING HERE. PATIENT USES [facility] [address]  BEST CALLBACK NUMBER: [phone]

## 2013-01-10 NOTE — Telephone Encounter (Signed)
LMOM that pt needs re-check before any more Rxs can be written.

## 2013-03-03 ENCOUNTER — Encounter: Payer: Self-pay | Admitting: Internal Medicine

## 2013-03-03 ENCOUNTER — Ambulatory Visit (INDEPENDENT_AMBULATORY_CARE_PROVIDER_SITE_OTHER): Payer: 59 | Admitting: Internal Medicine

## 2013-03-03 VITALS — BP 122/80 | HR 89 | Temp 98.2°F | Wt 180.0 lb

## 2013-03-03 DIAGNOSIS — J029 Acute pharyngitis, unspecified: Secondary | ICD-10-CM

## 2013-03-03 MED ORDER — FLUTICASONE PROPIONATE 50 MCG/ACT NA SUSP: NASAL | Status: DC

## 2013-03-03 MED ORDER — FLUOXETINE HCL 10 MG PO CAPS: ORAL_CAPSULE | ORAL | Status: DC

## 2013-03-03 NOTE — Progress Notes (Signed)
  Subjective:    Patient ID: Ashley Bond, female    DOB: 16-Jan-1991, 22 y.o.   MRN: [id number]  HPI  Symptoms began 03/01/13 as head congestion, sore throat, earache, malaise with arthralgias, and cough with scant sputum production. She also noted some wheezing. There was some  nasal discharge  she describes  as being white.  She does describe pain in the maxillary sinus area bilaterally.  She had some sneezing but this is  a intermittent issue.  She did take Mucinex D for the congestion w/o issue despite allergy history   Review of Systems  No significant frontal sinus pain, fever, chills, or sweats. No purulent nasal secretions.She had no associated dyspnea.  She recently graduated from [employer] [facility]; Dr. Bobby Rumpf had increased her fluoxetine to 30 mg daily. She is no longer able to get this through student health.       Objective:   Physical Exam General appearance:good health ;well nourished; no acute distress or increased work of breathing is present.  No  lymphadenopathy about the head, neck, or axilla noted.   Eyes: No conjunctival inflammation or lid edema is present.   Ears:  External ear exam shows no significant lesions or deformities.  Otoscopic examination reveals clear canals, tympanic membranes are intact bilaterally without bulging, retraction, inflammation or discharge.  Nose:  External nasal examination shows no deformity or inflammation. Jewelry on L.Nasal mucosa are pink and moist without lesions or exudates. No septal dislocation or deviation.No obstruction to airflow.   Oral exam: Dental hygiene is good; lips and gums are healthy appearing.There is no oropharyngeal erythema or exudate noted.   Neck:  No deformities, masses, or tenderness noted.   Supple with full range of motion without pain.   Heart:  Normal rate and regular rhythm. S1 and S2 normal without gallop, murmur, click, rub or other extra sounds.   Lungs:Chest clear to auscultation; no wheezes,  rhonchi,rales ,or rubs present.No increased work of breathing.    Extremities:  No cyanosis, edema, or clubbing  noted    Skin: Warm & dry          Assessment & Plan:  #1 acute upper respiratory symptoms without definitive rhinosinusitis symptoms being present  #2 cough with subjective wheezing. At this time no reactive airways is noted  Plan: See orders and recommendations

## 2013-03-03 NOTE — Patient Instructions (Addendum)
Plain Mucinex (NOT D) for thick secretions ;force NON dairy fluids .   Nasal cleansing in the shower as discussed with lather of mild shampoo.After 10 seconds wash off lather while  exhaling through nostrils. Make sure that all residual soap is removed to prevent irritation.  Fluticasone 1 spray in each nostril twice a day as needed. Use the "crossover" technique into opposite nostril spraying toward opposite ear @ 45 degree angle, not straight up into nostril.  Use a Neti pot daily only  as needed for significant sinus congestion; going from open side to congested side . Plain Allegra (NOT D )  160 daily , Loratidine 10 mg , OR Zyrtec 10 mg @ bedtime  as needed for itchy eyes & sneezing. Zicam Melts or Zinc lozenges ; vitamin C 2000 mg daily; & Echinacea for 4-7 days. Report fever, exudate("pus") or progressive pain.

## 2013-03-04 ENCOUNTER — Encounter: Payer: Self-pay | Admitting: Family Medicine

## 2013-03-05 ENCOUNTER — Encounter: Payer: Self-pay | Admitting: Family Medicine

## 2013-03-05 ENCOUNTER — Telehealth: Payer: Self-pay | Admitting: Family Medicine

## 2013-03-05 MED ORDER — CEFUROXIME AXETIL 500 MG PO TABS: 500.0000 mg | ORAL_TABLET | Freq: Two times a day (BID) | ORAL | Status: DC

## 2013-03-05 NOTE — Telephone Encounter (Signed)
Please verify his cefuroxime was called in as per message in My Chart

## 2013-03-05 NOTE — Telephone Encounter (Signed)
Pt seen by Dr Alwyn Ren- he is best to assess her sxs after doing the exam.  Will forward to him for advice

## 2013-03-05 NOTE — Telephone Encounter (Signed)
Per My chart message Rx sent to pharmacy for Cefuroxime 500 mg bid # 14 . And message sent advising Pt thru my chart and Pt verbally inform by call.

## 2013-03-05 NOTE — Telephone Encounter (Signed)
To MD for review     KP 

## 2013-03-05 NOTE — Telephone Encounter (Signed)
Patient Information:  Caller Name: Baani  Phone: [phone]  Patient: Eda Keys  Gender: Female  DOB: 12/03/1990  Age: 22 Years  PCP: Sheliah Hatch  Pregnant: No  Office Follow Up:  Does the office need to follow up with this patient?: Yes  Instructions For The Office: Requesting antibiotic be called into [facility] on [address]   Symptoms  Reason For Call & Symptoms: Seen in office on 03/03/13 and sx worse- having thick yellowish mucos, eye pressure, R ear pain, throat hurts and feeling chilled and having sweats. Requesting antibiotic.  Reviewed Health History In EMR: Yes  Reviewed Medications In EMR: Yes  Reviewed Allergies In EMR: Yes  Reviewed Surgeries / Procedures: Yes  Date of Onset of Symptoms: 03/01/2013  Treatments Tried: Muscinex , Saline and Flucanizole Nasal Spray. Ibuprofen 200 mgs PO q 6 hr, Claritin  Treatments Tried Worked: No  Any Fever: Yes  Fever Taken: Tactile  Fever Time Of Reading: 10:00:00  Fever Last Reading: N/A OB / GYN:  LMP: 02/10/2013  Guideline(s) Used:  Earache  Disposition Per Guideline:   See Today in Office  Reason For Disposition Reached:   All other earaches (Exceptions: earache lasting < 1 hour, and earache from air travel)  Advice Given:  Pain Medicines:  For pain relief, you can take either acetaminophen, ibuprofen, or naproxen.  They are over-the-counter (OTC) pain drugs. You can buy them at the drugstore.  Call Back If  Earache last more than 1 hour  High fever, severe headache, or stiff neck occurs  You become worse.  Patient Refused Recommendation:  Patient Requests Prescription  Requesting antibiotic be called into [facility] on [address].

## 2013-03-05 NOTE — Telephone Encounter (Signed)
Please advise.//AB/CMA 

## 2013-03-11 ENCOUNTER — Telehealth: Payer: Self-pay | Admitting: Family Medicine

## 2013-03-11 ENCOUNTER — Encounter: Payer: Self-pay | Admitting: Family Medicine

## 2013-03-11 NOTE — Telephone Encounter (Signed)
Patient Information:  Caller Name: Jaylie  Phone: [phone]  Patient: Ashley Bond, Ashley Bond  Gender: Female  DOB: 09-22-1991  Age: 22 Years  PCP: Sheliah Hatch  Pregnant: No  Office Follow Up:  Does the office need to follow up with this patient?: No  Instructions For The Office: N/A  RN Note:  Transferred to office for appt scheduling tomorrow due to schedule full for today.  Symptoms  Reason For Call & Symptoms: 03/03/13 seen in office diagnosed with sinus infection, placed on antibiotics.   Advised to call back if sxs persists after 3-4 days.  03/11/13 a lot of nasal congestion, very thick, unable to get secretions out, continues to be unable to hear out of right ear and pressure.  Afebrile.  Continues to take antibiotic and using Mucinex and Benedryl at night.  Reviewed Health History In EMR: Yes  Reviewed Medications In EMR: Yes  Reviewed Allergies In EMR: Yes  Reviewed Surgeries / Procedures: Yes  Date of Onset of Symptoms: 03/03/2013  Treatments Tried: Mucinex,Benedryl at night  Treatments Tried Worked: No OB / GYN:  LMP: 02/10/2013  Guideline(s) Used:  Sinus Pain and Congestion  Disposition Per Guideline:   See Today or Tomorrow in Office  Reason For Disposition Reached:   Sinus congestion (pressure, fullness) present > 10 days  Advice Given:  Call Back If:   Severe pain lasts longer than 2 hours after pain medicine  You become worse.  Patient Will Follow Care Advice:  YES

## 2013-03-11 NOTE — Telephone Encounter (Signed)
Pt has an appt on 5/28.

## 2013-03-12 ENCOUNTER — Encounter: Payer: Self-pay | Admitting: Family Medicine

## 2013-03-12 ENCOUNTER — Ambulatory Visit (INDEPENDENT_AMBULATORY_CARE_PROVIDER_SITE_OTHER): Payer: 59 | Admitting: Family Medicine

## 2013-03-12 VITALS — BP 116/70 | HR 90 | Temp 98.6°F | Ht 66.75 in | Wt 181.4 lb

## 2013-03-12 DIAGNOSIS — J309 Allergic rhinitis, unspecified: Secondary | ICD-10-CM | POA: Insufficient documentation

## 2013-03-12 NOTE — Assessment & Plan Note (Signed)
New.  Pt w/out evidence of current infxn.  Pt to start daily antihistamine.  Continue nasal steroid spray.  Short term decongestant prn.  Pt expressed understanding and is in agreement w/ plan.

## 2013-03-12 NOTE — Patient Instructions (Addendum)
This appears to be all allergy related Start Claritin or Zyrtec daily (over the counter) Continue the nasal steroid spray Add Mucinex DM to decrease and thin your congestion (~3 days) Drink plenty of fluids REST! Hang in there!

## 2013-03-12 NOTE — Progress Notes (Signed)
  Subjective:    Patient ID: Ashley Bond, female    DOB: 03-23-1991, 22 y.o.   MRN: [id number]  HPI URI- pt recently dx'd w/ sinus infxn and is to complete Ceftin today.  Reports R ear is still uncomfortable, having persistent nasal congestion w/ PND.  Using nasal steroid regularly.  Taking benadryl at night.  Using OTC tylenol allergy.  No fevers.  No cough.  No HA.  No sinus pain/pressure.   Review of Systems For ROS see HPI     Objective:   Physical Exam  Vitals reviewed. Constitutional: She appears well-developed and well-nourished. No distress.  HENT:  Head: Normocephalic and atraumatic.  Right Ear: Tympanic membrane normal.  Left Ear: Tympanic membrane normal.  Nose: Mucosal edema and rhinorrhea present. Right sinus exhibits no maxillary sinus tenderness and no frontal sinus tenderness. Left sinus exhibits no maxillary sinus tenderness and no frontal sinus tenderness.  Mouth/Throat: Mucous membranes are normal. Posterior oropharyngeal erythema (w/ PND) present.  Eyes: Conjunctivae and EOM are normal. Pupils are equal, round, and reactive to light.  Neck: Normal range of motion. Neck supple.  Cardiovascular: Normal rate, regular rhythm and normal heart sounds.   Pulmonary/Chest: Effort normal and breath sounds normal. No respiratory distress. She has no wheezes. She has no rales.  Lymphadenopathy:    She has no cervical adenopathy.       Assessment & Plan:

## 2013-04-01 ENCOUNTER — Other Ambulatory Visit: Payer: Self-pay | Admitting: Internal Medicine

## 2013-04-02 NOTE — Telephone Encounter (Signed)
Last refill:03-03-13 Last CPE:06-19-12 Please advise.//AB/CMA

## 2013-04-25 ENCOUNTER — Ambulatory Visit (INDEPENDENT_AMBULATORY_CARE_PROVIDER_SITE_OTHER): Payer: 59 | Admitting: Family Medicine

## 2013-04-25 ENCOUNTER — Encounter: Payer: Self-pay | Admitting: Family Medicine

## 2013-04-25 VITALS — BP 120/76 | HR 86 | Temp 98.4°F | Wt 183.6 lb

## 2013-04-25 DIAGNOSIS — R3915 Urgency of urination: Secondary | ICD-10-CM

## 2013-04-25 DIAGNOSIS — N390 Urinary tract infection, site not specified: Secondary | ICD-10-CM

## 2013-04-25 LAB — POCT URINALYSIS DIPSTICK
Nitrite: POSITIVE
Urobilinogen, UA: 0.2
pH: 7.5

## 2013-04-25 MED ORDER — CEPHALEXIN 500 MG PO CAPS: 500.0000 mg | ORAL_CAPSULE | Freq: Two times a day (BID) | ORAL | Status: AC

## 2013-04-25 NOTE — Assessment & Plan Note (Signed)
New.  Pt's sxs and PE consistent w/ infxn.  Start abx.  Reviewed supportive care and red flags that should prompt return.  Pt expressed understanding and is in agreement w/ plan.  

## 2013-04-25 NOTE — Patient Instructions (Addendum)
This is a UTI Start the keflex twice daily Drink plenty of fluids Consider starting cranberry extract Call with any questions or concerns Hang in there!!!

## 2013-04-25 NOTE — Progress Notes (Signed)
  Subjective:    Patient ID: Ashley Bond, female    DOB: 1991/02/05, 22 y.o.   MRN: [id number]  HPI UTI- sxs started ~1 week ago.  + dysuria, frequency, incomplete emptying, hesitancy.  + low back ache.  No fevers.  Mild nausea, no vomiting.   Review of Systems For ROS see HPI     Objective:   Physical Exam  Vitals reviewed. Constitutional: She appears well-developed and well-nourished. No distress.  Abdominal: Soft. She exhibits no distension. There is no tenderness (no suprapubic or CVA tenderness).          Assessment & Plan:

## 2013-04-28 LAB — URINE CULTURE: Colony Count: >=100000

## 2013-08-21 ENCOUNTER — Other Ambulatory Visit: Payer: Self-pay

## 2013-09-03 ENCOUNTER — Ambulatory Visit (INDEPENDENT_AMBULATORY_CARE_PROVIDER_SITE_OTHER): Payer: 59 | Admitting: Family Medicine

## 2013-09-03 ENCOUNTER — Encounter: Payer: Self-pay | Admitting: Family Medicine

## 2013-09-03 VITALS — BP 110/80 | HR 95 | Temp 98.5°F | Resp 16 | Wt 181.0 lb

## 2013-09-03 DIAGNOSIS — R1084 Generalized abdominal pain: Secondary | ICD-10-CM | POA: Insufficient documentation

## 2013-09-03 DIAGNOSIS — R109 Unspecified abdominal pain: Secondary | ICD-10-CM

## 2013-09-03 MED ORDER — DICYCLOMINE HCL 20 MG PO TABS: 20.0000 mg | ORAL_TABLET | Freq: Four times a day (QID) | ORAL | Status: DC

## 2013-09-03 NOTE — Assessment & Plan Note (Signed)
New.  Suspect this is IBS but will get labs to r/o infxn, biliary dysfxn, electrolyte abnormality.  Start bentyl.  Reviewed lifestyle modifications.  If no sxs improvement, will refer to GI.  Pt expressed understanding and is in agreement w/ plan.

## 2013-09-03 NOTE — Progress Notes (Signed)
  Subjective:    Patient ID: Ashley Bond, female    DOB: 1991-02-22, 22 y.o.   MRN: [id number]  HPI Pre visit review using our clinic review tool, if applicable. No additional management support is needed unless otherwise documented below in the visit note.  abd pain- 'sharp stabbing pain'.  sxs started 7-10 days ago.  Ashley Bond 'felt very bloated and uncomfortable'.  At times pain will double pt over and feel as if she can't stand upright.  No fevers.  No nausea.  No diarrhea.  + constipation x2 weeks.  Some improvement in pain w/ BM.  At times will have pain radiating up to R shoulder.   Review of Systems For ROS see HPI     Objective:   Physical Exam  Vitals reviewed. Constitutional: She is oriented to person, place, and time. She appears well-developed and well-nourished. No distress.  Cardiovascular: Normal rate, regular rhythm and normal heart sounds.   Abdominal: Soft. Bowel sounds are normal. She exhibits distension (mild). She exhibits no mass. There is tenderness (diffuse, mild abdominal TTP). There is no rebound and no guarding.  Neurological: She is alert and oriented to person, place, and time.  Skin: Skin is warm and dry.  Psychiatric: She has a normal mood and affect. Her behavior is normal.          Assessment & Plan:

## 2013-09-03 NOTE — Patient Instructions (Signed)
Follow up as needed We'll notify you of your lab results and make any changes if needed Start the Bentyl as needed for abdominal spasm Drink plenty of water, eat lots of fiber, get regular exercise Start Miralax as needed for constipation Call with any questions or concerns Hang in there! Happy Holidays!!!

## 2013-09-04 LAB — BASIC METABOLIC PANEL
BUN: 14 mg/dL (ref 6–23)
Calcium: 9.1 mg/dL (ref 8.4–10.5)
GFR: 108.5 mL/min (ref >60.00)
Glucose, Bld: 83 mg/dL (ref 70–99)
Sodium: 136 mEq/L (ref 135–145)

## 2013-09-04 LAB — CBC WITH DIFFERENTIAL/PLATELET
Basophils Absolute: 0 10*3/uL (ref 0.0–0.1)
Eosinophils Relative: 1.7 % (ref 0.0–5.0)
Lymphocytes Relative: 28.6 % (ref 12.0–46.0)
Monocytes Relative: 5.2 % (ref 3.0–12.0)
Platelets: 380 10*3/uL (ref 150.0–400.0)
RDW: 13.2 % (ref 11.5–14.6)
WBC: 7.5 10*3/uL (ref 4.5–10.5)

## 2013-09-04 LAB — HEPATIC FUNCTION PANEL
AST: 20 U/L (ref 0–37)
Alkaline Phosphatase: 66 U/L (ref 39–117)
Bilirubin, Direct: 0.1 mg/dL (ref 0.0–0.3)
Total Bilirubin: 0.5 mg/dL (ref 0.3–1.2)

## 2013-11-08 ENCOUNTER — Other Ambulatory Visit: Payer: Self-pay | Admitting: Family Medicine

## 2013-11-10 NOTE — Telephone Encounter (Signed)
Med filled.  

## 2013-12-06 ENCOUNTER — Encounter: Payer: Self-pay | Admitting: Family Medicine

## 2013-12-06 ENCOUNTER — Ambulatory Visit (INDEPENDENT_AMBULATORY_CARE_PROVIDER_SITE_OTHER): Payer: 59 | Admitting: Family Medicine

## 2013-12-06 VITALS — BP 124/80 | Temp 98.4°F | Wt 185.0 lb

## 2013-12-06 DIAGNOSIS — J019 Acute sinusitis, unspecified: Secondary | ICD-10-CM

## 2013-12-06 MED ORDER — AMOXICILLIN 875 MG PO TABS: 875.0000 mg | ORAL_TABLET | Freq: Two times a day (BID) | ORAL | Status: DC

## 2013-12-06 NOTE — Progress Notes (Signed)
OFFICE NOTE  12/06/2013  CC:  Chief Complaint  Patient presents with  . Sinusitis     HPI: Patient is a 23 y.o. Caucasian female who is here for cold symptoms.  About 2+ weeks of sinus congestion/mucous from nose, PND, some cough last couple of days, feeling run down. No fever.  Mucinex D, nyquil, tea--helpful somewhat. Flonase on med list but not taking.  Pertinent PMH:  Exercise induced bronchospasm Tonsillectomy  MEDS:  Outpatient Prescriptions Prior to Visit  Medication Sig Dispense Refill  . clobetasol (TEMOVATE) 0.05 % external solution Apply to scalp twice a day as needed  50 mL  1  . dicyclomine (BENTYL) 20 MG tablet Take 1 tablet (20 mg total) by mouth every 6 (six) hours.  60 tablet  1  . diphenhydrAMINE (BENADRYL) 25 mg capsule Take 25 mg by mouth every 6 (six) hours as needed.      Marland Kitchen EPINEPHrine (EPIPEN) 0.3 mg/0.3 mL DEVI Inject 0.3 mg into the muscle once.      . etonogestrel-ethinyl estradiol (NUVARING) 0.12-0.015 MG/24HR vaginal ring Place 1 each vaginally every 28 (twenty-eight) days. Insert vaginally and leave in place for 3 consecutive weeks, then remove for 1 week.      Marland Kitchen FLUoxetine (PROZAC) 10 MG capsule TAKE 3 CAPSULES BY MOUTH EVERY DAY  270 capsule  0  . fluticasone (FLONASE) 50 MCG/ACT nasal spray 1 spray in each nostril twice a day as needed. Use the "crossover" technique as discussed  16 g  2  . ibuprofen (ADVIL,MOTRIN) 200 MG tablet Take 200 mg by mouth every 6 (six) hours as needed.       No facility-administered medications prior to visit.    PE: Blood pressure 124/80, temperature 98.4 F (36.9 C), temperature source Oral, weight 185 lb (83.915 kg). VS: noted--normal. Gen: alert, NAD, NONTOXIC APPEARING. HEENT: eyes without injection, drainage, or swelling.  Ears: EACs clear, TMs with normal light reflex and landmarks.  Nose: Clear rhinorrhea, with some dried, crusty exudate adherent to mildly injected mucosa.  No purulent d/c.  No paranasal sinus  TTP.  No facial swelling.  Throat and mouth without focal lesion.  No pharyngial swelling, erythema, or exudate.   Neck: supple, no LAD.   LUNGS: CTA bilat, nonlabored resps.   CV: RRR, no m/r/g. EXT: no c/c/e SKIN: no rash    IMPRESSION AND PLAN:  Acute sinusitis. Nasal irrigation encouraged. Amoxil 875 bid x 10d.  An After Visit Summary was printed and given to the patient.  FOLLOW UP: prn

## 2013-12-06 NOTE — Progress Notes (Signed)
Pre visit review using our clinic review tool, if applicable. No additional management support is needed unless otherwise documented below in the visit note. 

## 2014-03-18 ENCOUNTER — Encounter: Payer: Self-pay | Admitting: Internal Medicine

## 2014-03-18 ENCOUNTER — Ambulatory Visit (INDEPENDENT_AMBULATORY_CARE_PROVIDER_SITE_OTHER): Payer: 59 | Admitting: Internal Medicine

## 2014-03-18 VITALS — BP 110/70 | HR 89 | Temp 98.2°F | Ht 66.5 in | Wt 182.6 lb

## 2014-03-18 DIAGNOSIS — J0100 Acute maxillary sinusitis, unspecified: Secondary | ICD-10-CM

## 2014-03-18 MED ORDER — AMOXICILLIN 875 MG PO TABS: 875.0000 mg | ORAL_TABLET | Freq: Two times a day (BID) | ORAL | Status: DC

## 2014-03-18 NOTE — Progress Notes (Signed)
   Subjective:    Patient ID: Ashley Bond, female    DOB: Jul 15, 1991, 23 y.o.   MRN: [id number]  HPI  Symptoms began 03/15/14 as a sore throat, rhinitis, and fatigue.  She describes both frontal and maxillary sinus area pain. She has yellow nasal discharge which is insignificant in quantity. Cough is productive of clear scant secretions  One factor may have been a component of seasonal allergen exposure.  Symptoms are progressing and now associated with cough, tightness of chest, otic fullness and discomfort and myalgias/arthralgias. She's had some sneezing as well as some wheezing.  She has ongoing rhinitis and watery eyes.  Mucinex D helped but interfered with sleep. She's also used Benadryl the last 2 nights  She's used nonsteroidals and saline spray. NyQuil severe cold was not helpful  She has a history of reactive airways disease with exercise and seasonal allergens.    Review of Systems  She is not having fever, chills, or sweats. No SOB or wheezing despite PMH.     Objective:   Physical Exam  General appearance:good health ;well nourished; no acute distress or increased work of breathing is present.  No  lymphadenopathy about the head, neck, or axilla noted.   Eyes: No conjunctival inflammation or lid edema is present. There is no scleral icterus.  Ears:  External ear exam shows no significant lesions or deformities.  Otoscopic examination reveals clear canals, tympanic membranes are intact bilaterally without bulging, retraction, inflammation or discharge.  Nose:  External nasal examination shows no deformity or inflammation. Post L nare.Nasal mucosa are dry without lesions or exudates. No septal dislocation or deviation.No obstruction to airflow.   Oral exam: Dental hygiene is good; lips and gums are healthy appearing.There is no oropharyngeal erythema or exudate noted.   Neck:  No deformities, thyromegaly, masses, or tenderness noted.   Supple with full range of  motion without pain.   Heart:  Normal rate and regular rhythm. S1 and S2 normal without gallop, murmur, click, rub or other extra sounds.   Lungs:Chest clear to auscultation; no wheezes, rhonchi,rales ,or rubs present.No increased work of breathing.    Extremities:  No cyanosis, edema, or clubbing  noted    Skin: Warm & dry w/o jaundice or tenting.        Assessment & Plan:  #1 rhinosinusitis without significant bronchitis  Plan: Nasal hygiene interventions discussed. See prescription medications

## 2014-03-18 NOTE — Patient Instructions (Signed)

## 2014-03-18 NOTE — Progress Notes (Signed)
Pre visit review using our clinic review tool, if applicable. No additional management support is needed unless otherwise documented below in the visit note. 

## 2014-03-19 ENCOUNTER — Encounter: Payer: Self-pay | Admitting: Internal Medicine

## 2014-06-15 ENCOUNTER — Ambulatory Visit (INDEPENDENT_AMBULATORY_CARE_PROVIDER_SITE_OTHER): Payer: 59 | Admitting: Physician Assistant

## 2014-06-15 ENCOUNTER — Encounter: Payer: Self-pay | Admitting: Physician Assistant

## 2014-06-15 VITALS — BP 120/80 | HR 93 | Temp 98.0°F | Wt 178.0 lb

## 2014-06-15 DIAGNOSIS — R202 Paresthesia of skin: Secondary | ICD-10-CM

## 2014-06-15 DIAGNOSIS — R209 Unspecified disturbances of skin sensation: Secondary | ICD-10-CM

## 2014-06-15 DIAGNOSIS — R200 Anesthesia of skin: Secondary | ICD-10-CM | POA: Insufficient documentation

## 2014-06-15 DIAGNOSIS — M25549 Pain in joints of unspecified hand: Secondary | ICD-10-CM

## 2014-06-15 DIAGNOSIS — M2560 Stiffness of unspecified joint, not elsewhere classified: Secondary | ICD-10-CM | POA: Insufficient documentation

## 2014-06-15 DIAGNOSIS — M79643 Pain in unspecified hand: Secondary | ICD-10-CM

## 2014-06-15 MED ORDER — NAPROXEN SODIUM 550 MG PO TABS: 550.0000 mg | ORAL_TABLET | Freq: Two times a day (BID) | ORAL | Status: DC

## 2014-06-15 NOTE — Progress Notes (Signed)
Pre visit review using our clinic review tool, if applicable. No additional management support is needed unless otherwise documented below in the visit note. 

## 2014-06-15 NOTE — Patient Instructions (Signed)
Please take Anaprox twice daily as directed with food over the next week.  You can then reduce to one tablet daily.  Wear supportive shoes.  Apply topical Aspercreme or Icy Hot to hands to help with pain/stiffness.  I will call you with your results.  We will know more once results are in.

## 2014-06-15 NOTE — Assessment & Plan Note (Signed)
Sensory exam within normal limits.  Will check BMP and B12 levels.

## 2014-06-15 NOTE — Assessment & Plan Note (Signed)
Will obtain ESR, RF and Anti-CCP.  Discussed ROM exercises.  Rx anaprox for pain to take with food. 

## 2014-06-15 NOTE — Progress Notes (Signed)
Patient presents to clinic today c/o stiffness of hands bilaterally with R>L that last > 1 hour in the morning.  Symptoms have been present over the past several months.  Worsened over the past 1-2 months.  Endorses aching pain of joints.  Denies trauma or injury.  Denies decreased ROM.  Denies numbness or tingling of hands.  Patent does endorse intermittent tingling and numbness of feet bilaterally.  Denies hx of diabetes or neuropathic symptoms.  Does not smoke. Again, denies trauma or injury.  Past Medical History  Diagnosis Date  . Chicken pox   . Anxiety   . Allergy   . Asthma     exercise induced    Current Outpatient Prescriptions on File Prior to Visit  Medication Sig Dispense Refill  . diphenhydrAMINE (BENADRYL) 25 mg capsule Take 25 mg by mouth every 6 (six) hours as needed.      Marland Kitchen EPINEPHrine (EPIPEN) 0.3 mg/0.3 mL DEVI Inject 0.3 mg into the muscle once.      . etonogestrel-ethinyl estradiol (NUVARING) 0.12-0.015 MG/24HR vaginal ring Place 1 each vaginally every 28 (twenty-eight) days. Insert vaginally and leave in place for 3 consecutive weeks, then remove for 1 week.      Marland Kitchen FLUoxetine (PROZAC) 10 MG capsule TAKE 3 CAPSULES BY MOUTH EVERY DAY  270 capsule  0  . amoxicillin (AMOXIL) 875 MG tablet Take 1 tablet (875 mg total) by mouth 2 (two) times daily.  20 tablet  0  . clobetasol (TEMOVATE) 0.05 % external solution Apply to scalp twice a day as needed  50 mL  1  . dicyclomine (BENTYL) 20 MG tablet Take 1 tablet (20 mg total) by mouth every 6 (six) hours.  60 tablet  1  . fluticasone (FLONASE) 50 MCG/ACT nasal spray 1 spray in each nostril twice a day as needed. Use the "crossover" technique as discussed  16 g  2  . ibuprofen (ADVIL,MOTRIN) 200 MG tablet Take 200 mg by mouth every 6 (six) hours as needed.       No current facility-administered medications on file prior to visit.    Allergies  Allergen Reactions  . Shellfish Allergy   . Pseudoephedrine Hcl Er   . Soy  Allergy     Family History  Problem Relation Age of Onset  . Hyperlipidemia Mother   . Polycystic ovary syndrome Mother   . Breast cancer Maternal Aunt   . Breast cancer Maternal Grandmother   . Cancer Maternal Grandmother     breast  . Breast cancer Paternal Grandmother   . Arthritis Paternal Grandfather   . Cancer Paternal Grandfather     breast  . Heart disease Father   . Sleep apnea Father   . Sleep apnea Brother     History   Social History  . Marital Status: Single    Spouse Name: N/A    Number of Children: N/A  . Years of Education: N/A   Social History Main Topics  . Smoking status: Current Some Day Smoker    Types: Cigarettes  . Smokeless tobacco: None     Comment: 1/2 PACK X 1 WEEK   . Alcohol Use: Yes     Comment: A COUPLE DRINKS NIGHTLY   . Drug Use: No  . Sexual Activity: Yes    Birth Control/ Protection: Inserts   Other Topics Concern  . None   Social History Narrative  . None   Review of Systems - See HPI.  All other ROS are negative.  BP 120/80  Pulse 93  Temp(Src) 98 F (36.7 C)  Wt 178 lb (80.74 kg)  SpO2 98%  Physical Exam  Vitals reviewed. Constitutional: She is oriented to person, place, and time and well-developed, well-nourished, and in no distress.  HENT:  Head: Normocephalic and atraumatic.  Eyes: Conjunctivae are normal.  Neck: Neck supple.  Cardiovascular: Normal rate, regular rhythm, normal heart sounds and intact distal pulses.   Pulmonary/Chest: Effort normal and breath sounds normal.  Musculoskeletal:       Right hand: She exhibits tenderness. She exhibits normal range of motion, no bony tenderness and normal two-point discrimination. Normal sensation noted. Normal strength noted.       Left hand: She exhibits tenderness. She exhibits normal range of motion, no bony tenderness and normal two-point discrimination. Normal sensation noted. Normal strength noted.       Right foot: She exhibits normal range of motion, no  tenderness and no swelling.       Left foot: She exhibits normal range of motion, no tenderness and no swelling.  Neurological: She is alert and oriented to person, place, and time. She has normal sensation, normal strength and normal reflexes.  Skin: Skin is warm and dry. No rash noted.  Psychiatric: Affect normal.   Assessment/Plan: Joint stiffness Will obtain ESR, RF and Anti-CCP.  Discussed ROM exercises.  Rx anaprox for pain to take with food.  Numbness and tingling of both legs Sensory exam within normal limits.  Will check BMP and B12 levels.

## 2014-06-16 LAB — VITAMIN B12: Vitamin B-12: 345 pg/mL (ref 211–911)

## 2014-06-16 LAB — BASIC METABOLIC PANEL
BUN: 14 mg/dL (ref 6–23)
CALCIUM: 9.8 mg/dL (ref 8.4–10.5)
CO2: 29 mEq/L (ref 19–32)
CREATININE: 0.8 mg/dL (ref 0.4–1.2)
Chloride: 101 mEq/L (ref 96–112)
GFR: 98.06 mL/min (ref >60.00)
GLUCOSE: 99 mg/dL (ref 70–99)
Potassium: 3.9 mEq/L (ref 3.5–5.1)
Sodium: 138 mEq/L (ref 135–145)

## 2014-06-16 LAB — SEDIMENTATION RATE: Sed Rate: 11 mm/hr (ref 0–22)

## 2014-06-16 LAB — RHEUMATOID FACTOR: RHEUMATOID FACTOR: 13 [IU]/mL (ref <=14)

## 2014-06-16 LAB — CYCLIC CITRUL PEPTIDE ANTIBODY, IGG

## 2014-07-31 ENCOUNTER — Encounter: Payer: Self-pay | Admitting: Physician Assistant

## 2014-07-31 ENCOUNTER — Ambulatory Visit (INDEPENDENT_AMBULATORY_CARE_PROVIDER_SITE_OTHER): Payer: 59 | Admitting: Physician Assistant

## 2014-07-31 VITALS — BP 128/78 | HR 82 | Temp 98.5°F | Resp 16 | Ht 66.5 in | Wt 177.5 lb

## 2014-07-31 DIAGNOSIS — R399 Unspecified symptoms and signs involving the genitourinary system: Secondary | ICD-10-CM

## 2014-07-31 DIAGNOSIS — G5603 Carpal tunnel syndrome, bilateral upper limbs: Secondary | ICD-10-CM | POA: Insufficient documentation

## 2014-07-31 DIAGNOSIS — Z8349 Family history of other endocrine, nutritional and metabolic diseases: Secondary | ICD-10-CM

## 2014-07-31 DIAGNOSIS — G5602 Carpal tunnel syndrome, left upper limb: Secondary | ICD-10-CM

## 2014-07-31 DIAGNOSIS — G5601 Carpal tunnel syndrome, right upper limb: Secondary | ICD-10-CM

## 2014-07-31 LAB — POCT URINALYSIS DIPSTICK
BILIRUBIN UA: NEGATIVE
Glucose, UA: NEGATIVE
Ketones, UA: NEGATIVE
Nitrite, UA: NEGATIVE
PH UA: 6
PROTEIN UA: NEGATIVE
SPEC GRAV UA: 1.02
Urobilinogen, UA: 0.2

## 2014-07-31 LAB — TSH: TSH: 0.75 u[IU]/mL (ref 0.35–4.50)

## 2014-07-31 MED ORDER — NITROFURANTOIN MONOHYD MACRO 100 MG PO CAPS: 100.0000 mg | ORAL_CAPSULE | Freq: Two times a day (BID) | ORAL | Status: DC

## 2014-07-31 MED ORDER — METHYLPREDNISOLONE 4 MG PO KIT: PACK | ORAL | Status: DC

## 2014-07-31 NOTE — Progress Notes (Signed)
Pre visit review using our clinic review tool, if applicable. No additional management support is needed unless otherwise documented below in the visit note/SLS  

## 2014-07-31 NOTE — Patient Instructions (Signed)
For UTI symptoms -- increase fluid intake.  Take the Macrobid twice daily until all tablets are gone.  Continue cranberry supplement.  I will call you with your urine culture results.  For Carpal Tunnel -- wear braces nightly.  Elevate arms on a pillow.  Take the steroid pack as directed.  I will call you with your thyroid results as well.  Urinary Tract Infection Urinary tract infections (UTIs) can develop anywhere along your urinary tract. Your urinary tract is your body's drainage system for removing wastes and extra water. Your urinary tract includes two kidneys, two ureters, a bladder, and a urethra. Your kidneys are a pair of bean-shaped organs. Each kidney is about the size of your fist. They are located below your ribs, one on each side of your spine. CAUSES Infections are caused by microbes, which are microscopic organisms, including fungi, viruses, and bacteria. These organisms are so small that they can only be seen through a microscope. Bacteria are the microbes that most commonly cause UTIs. SYMPTOMS  Symptoms of UTIs may vary by age and gender of the patient and by the location of the infection. Symptoms in young women typically include a frequent and intense urge to urinate and a painful, burning feeling in the bladder or urethra during urination. Older women and men are more likely to be tired, shaky, and weak and have muscle aches and abdominal pain. A fever may mean the infection is in your kidneys. Other symptoms of a kidney infection include pain in your back or sides below the ribs, nausea, and vomiting. DIAGNOSIS To diagnose a UTI, your caregiver will ask you about your symptoms. Your caregiver also will ask to provide a urine sample. The urine sample will be tested for bacteria and white blood cells. White blood cells are made by your body to help fight infection. TREATMENT  Typically, UTIs can be treated with medication. Because most UTIs are caused by a bacterial infection,  they usually can be treated with the use of antibiotics. The choice of antibiotic and length of treatment depend on your symptoms and the type of bacteria causing your infection. HOME CARE INSTRUCTIONS  If you were prescribed antibiotics, take them exactly as your caregiver instructs you. Finish the medication even if you feel better after you have only taken some of the medication.  Drink enough water and fluids to keep your urine clear or pale yellow.  Avoid caffeine, tea, and carbonated beverages. They tend to irritate your bladder.  Empty your bladder often. Avoid holding urine for long periods of time.  Empty your bladder before and after sexual intercourse.  After a bowel movement, women should cleanse from front to back. Use each tissue only once. SEEK MEDICAL CARE IF:   You have back pain.  You develop a fever.  Your symptoms do not begin to resolve within 3 days. SEEK IMMEDIATE MEDICAL CARE IF:   You have severe back pain or lower abdominal pain.  You develop chills.  You have nausea or vomiting.  You have continued burning or discomfort with urination. MAKE SURE YOU:   Understand these instructions.  Will watch your condition.  Will get help right away if you are not doing well or get worse. Document Released: 07/12/2005 Document Revised: 04/02/2012 Document Reviewed: 11/10/2011 [facility] Patient Information 2015 [place], [place]. This information is not intended to replace advice given to you by your health care provider. Make sure you discuss any questions you have with your health care provider.

## 2014-07-31 NOTE — Assessment & Plan Note (Signed)
Will check TSH per patient's concerns.

## 2014-07-31 NOTE — Progress Notes (Signed)
Patient presents to clinic today c/o bilateral wrist pain x 2 weeks associated with weakness and numbness in radial distribution.  Denies trauma or injury.  Has taken Ibuprofen with little relief of symptoms.  Patient also c/o urinary frequency, urgency and dysuria x 1 week.  Denies nausea, vomiting, fever, chills or flank pain.  Patient requesting thyroid testing due to endorsed + family history of hypothyroidism.  Patient endorses some constipation and cold intolerance.  Denies other common symptoms of hypothyroidism.  Past Medical History  Diagnosis Date  . Chicken pox   . Anxiety   . Allergy   . Asthma     exercise induced    Current Outpatient Prescriptions on File Prior to Visit  Medication Sig Dispense Refill  . diphenhydrAMINE (BENADRYL) 25 mg capsule Take 25 mg by mouth every 6 (six) hours as needed.      Marland Kitchen EPINEPHrine (EPIPEN) 0.3 mg/0.3 mL DEVI Inject 0.3 mg into the muscle once.      . etonogestrel-ethinyl estradiol (NUVARING) 0.12-0.015 MG/24HR vaginal ring Place 1 each vaginally every 28 (twenty-eight) days. Insert vaginally and leave in place for 3 consecutive weeks, then remove for 1 week.      . fluticasone (FLONASE) 50 MCG/ACT nasal spray 1 spray in each nostril twice a day as needed. Use the "crossover" technique as discussed  16 g  2  . ibuprofen (ADVIL,MOTRIN) 200 MG tablet Take 200 mg by mouth every 6 (six) hours as needed.       No current facility-administered medications on file prior to visit.    Allergies  Allergen Reactions  . Shellfish Allergy   . Pseudoephedrine Hcl Er   . Soy Allergy     Family History  Problem Relation Age of Onset  . Hyperlipidemia Mother   . Polycystic ovary syndrome Mother   . Breast cancer Maternal Aunt   . Breast cancer Maternal Grandmother   . Cancer Maternal Grandmother     breast  . Breast cancer Paternal Grandmother   . Arthritis Paternal Grandfather   . Cancer Paternal Grandfather     breast  . Heart disease  Father   . Sleep apnea Father   . Sleep apnea Brother     History   Social History  . Marital Status: Single    Spouse Name: N/A    Number of Children: N/A  . Years of Education: N/A   Social History Main Topics  . Smoking status: Former Smoker    Types: Cigarettes    Quit date: 10/31/2012  . Smokeless tobacco: None     Comment: 1/2 PACK X 1 WEEK   . Alcohol Use: Yes     Comment: A COUPLE DRINKS NIGHTLY   . Drug Use: No  . Sexual Activity: Yes    Birth Control/ Protection: Inserts   Other Topics Concern  . None   Social History Narrative  . None   Review of Systems - See HPI.  All other ROS are negative.  BP 128/78  Pulse 82  Temp(Src) 98.5 F (36.9 C) (Oral)  Resp 16  Ht 5' 6.5" (1.689 m)  Wt 177 lb 8 oz (80.513 kg)  BMI 28.22 kg/m2  SpO2 100%  LMP 07/14/2014  Physical Exam  Vitals reviewed. Constitutional: She is oriented to person, place, and time and well-developed, well-nourished, and in no distress.  HENT:  Head: Normocephalic and atraumatic.  Eyes: Conjunctivae are normal.  Neck: Neck supple.  Cardiovascular: Normal rate, regular rhythm, normal heart  sounds and intact distal pulses.   Pulmonary/Chest: Effort normal and breath sounds normal. No respiratory distress. She has no wheezes. She has no rales. She exhibits no tenderness.  Abdominal:  Negative CVA tenderness.  Musculoskeletal:       Right wrist: She exhibits tenderness. She exhibits normal range of motion and no bony tenderness.       Left wrist: She exhibits tenderness. She exhibits normal range of motion and no bony tenderness.  + Tinel sign bilaterally. Questionable Phalen sign.  Neurological: She is alert and oriented to person, place, and time.  Skin: Skin is warm and dry. No rash noted.  Psychiatric: Affect normal.    Recent Results (from the past 2160 hour(s))  RHEUMATOID FACTOR     Status: None   Collection Time    06/15/14  4:34 PM      Result Value Ref Range   Rheumatoid  Factor 13  <=14 IU/mL   Comment:                                Interpretive Table                         Low Positive: 15 - 41 IU/mL                         High Positive:  >= 42 IU/mL            In addition to the RF result, and clinical symptoms including joint      involvement, the 2010 ACR Classification Criteria for      scoring/diagnosing Rheumatoid Arthritis include the results of the      following tests:  CRP (81191), ESR (15010), and CCP (APCA) (47829).      [email or web address]  CYCLIC CITRUL PEPTIDE ANTIBODY, IGG     Status: None   Collection Time    06/15/14  4:34 PM      Result Value Ref Range   Cyclic Citrullin Peptide Ab <2.0  0.0 - 5.0 U/mL   Comment:                                 Interpretive Table                          Low Positive:  5.1 - 14.9 IU/mL                          High Positive:  >= 15.0 IU/mL            In addition to the CCP (APCA) result, and clinical symptoms including      joint involvement, the 2010 ACR Classification Criteria for      scoring/diagnosing Rheumatoid Arthritis include the results of the      following tests:  RF (59563), CRP (87564), and ESR (15010).      [email or web address]/ra_[email or web address]  SEDIMENTATION RATE     Status: None   Collection Time    06/15/14  4:38 PM      Result Value Ref Range   Sed Rate 11  0 - 22 mm/hr  VITAMIN B12     Status: None   Collection Time    06/15/14  4:38 PM  Result Value Ref Range   Vitamin B-12 345  211 - 911 pg/mL  BASIC METABOLIC PANEL     Status: None   Collection Time    06/15/14  4:38 PM      Result Value Ref Range   Sodium 138  135 - 145 mEq/L   Potassium 3.9  3.5 - 5.1 mEq/L   Chloride 101  96 - 112 mEq/L   CO2 29  19 - 32 mEq/L   Glucose, Bld 99  70 - 99 mg/dL   BUN 14  6 - 23 mg/dL   Creatinine, Ser 0.8  0.4 - 1.2 mg/dL   Calcium 9.8  8.4 - 10.5 mg/dL   GFR 98.06  >60.00 mL/min  POCT  URINALYSIS DIPSTICK     Status: None   Collection Time    07/31/14 11:40 AM      Result Value Ref Range   Color, UA yellow     Clarity, UA clear     Glucose, UA neg     Bilirubin, UA neg     Ketones, UA neg     Spec Grav, UA 1.020     Blood, UA trace     pH, UA 6.0     Protein, UA neg     Urobilinogen, UA 0.2     Nitrite, UA neg     Leukocytes, UA Trace      Assessment/Plan: UTI symptoms Urine dip with blood and LE.  Will send for culture.  Giving classic symptomology, will Rx Macrobid empirically.  Will change/alter regimen based on culture results.  Increase fluid. Probiotic and Cranberry supplement.  Family history of hypothyroidism Will check TSH per patient's concerns.  Bilateral carpal tunnel syndrome Bilateral wrist splints given with instructions on usage.  Elevate arms while sleeping.  Rx Medrol dose pack.

## 2014-07-31 NOTE — Assessment & Plan Note (Signed)
Bilateral wrist splints given with instructions on usage.  Elevate arms while sleeping.  Rx Medrol dose pack.

## 2014-07-31 NOTE — Assessment & Plan Note (Signed)
Urine dip with blood and LE.  Will send for culture.  Giving classic symptomology, will Rx Macrobid empirically.  Will change/alter regimen based on culture results.  Increase fluid. Probiotic and Cranberry supplement.

## 2014-08-04 LAB — CULTURE, URINE COMPREHENSIVE: Colony Count: 80000

## 2014-10-07 ENCOUNTER — Ambulatory Visit: Payer: 59 | Admitting: Internal Medicine

## 2014-10-15 ENCOUNTER — Other Ambulatory Visit (INDEPENDENT_AMBULATORY_CARE_PROVIDER_SITE_OTHER): Payer: 59

## 2014-10-15 ENCOUNTER — Ambulatory Visit (INDEPENDENT_AMBULATORY_CARE_PROVIDER_SITE_OTHER): Payer: 59 | Admitting: Internal Medicine

## 2014-10-15 ENCOUNTER — Ambulatory Visit (INDEPENDENT_AMBULATORY_CARE_PROVIDER_SITE_OTHER): Payer: 59 | Admitting: Geriatric Medicine

## 2014-10-15 ENCOUNTER — Encounter: Payer: Self-pay | Admitting: Internal Medicine

## 2014-10-15 VITALS — BP 120/72 | HR 97 | Temp 98.2°F | Resp 12 | Ht 65.0 in | Wt 178.0 lb

## 2014-10-15 DIAGNOSIS — R1084 Generalized abdominal pain: Secondary | ICD-10-CM

## 2014-10-15 DIAGNOSIS — Z23 Encounter for immunization: Secondary | ICD-10-CM

## 2014-10-15 DIAGNOSIS — Z0000 Encounter for general adult medical examination without abnormal findings: Secondary | ICD-10-CM

## 2014-10-15 DIAGNOSIS — R109 Unspecified abdominal pain: Secondary | ICD-10-CM

## 2014-10-15 LAB — COMPREHENSIVE METABOLIC PANEL
ALK PHOS: 67 U/L (ref 39–117)
ALT: 21 U/L (ref 0–35)
AST: 23 U/L (ref 0–37)
Albumin: 4 g/dL (ref 3.5–5.2)
BILIRUBIN TOTAL: 0.3 mg/dL (ref 0.2–1.2)
BUN: 8 mg/dL (ref 6–23)
CO2: 25 mEq/L (ref 19–32)
Calcium: 9.3 mg/dL (ref 8.4–10.5)
Chloride: 105 mEq/L (ref 96–112)
Creatinine, Ser: 0.7 mg/dL (ref 0.4–1.2)
GFR: 115.09 mL/min (ref >60.00)
Glucose, Bld: 86 mg/dL (ref 70–99)
Potassium: 4.1 mEq/L (ref 3.5–5.1)
Sodium: 138 mEq/L (ref 135–145)
TOTAL PROTEIN: 7.2 g/dL (ref 6.0–8.3)

## 2014-10-15 LAB — LIPID PANEL
CHOLESTEROL: 296 mg/dL — AB (ref 0–200)
HDL: 141.3 mg/dL (ref >39.00)
LDL Cholesterol: 143 mg/dL — ABNORMAL HIGH (ref 0–99)
NonHDL: 154.7
TRIGLYCERIDES: 58 mg/dL (ref 0.0–149.0)
Total CHOL/HDL Ratio: 2
VLDL: 11.6 mg/dL (ref 0.0–40.0)

## 2014-10-15 NOTE — Assessment & Plan Note (Signed)
Check CMP, celiac panel. She has done well off gluten and encouraged her to continue this change. Given info about gluten free diet as well as celiac disease.

## 2014-10-15 NOTE — Progress Notes (Signed)
Pre visit review using our clinic review tool, if applicable. No additional management support is needed unless otherwise documented below in the visit note. 

## 2014-10-15 NOTE — Assessment & Plan Note (Signed)
Given flu shot, up to date on pap smear and tetanus shot. Non-smoker and exercising regularly.

## 2014-10-15 NOTE — Patient Instructions (Signed)
We will check the lab tests for celiac disease and let you know about the results. It usually takes about 5-7 days for these to return.   We will also check on your cholesterol and kidneys to make sure they are normal.   We will see you back next year for your physical and if you have any problems or questions please feel free to call our office.   Gluten-Free Diet for Celiac Disease Gluten is a protein found in wheat, rye, barley, and triticale (a cross between wheat and rye) grains. People with celiac disease need to have a gluten-free diet. With celiac disease, gluten interferes with the absorption of food and may also cause intestinal injury.  Strict compliance is important even during symptom-free periods. This means eliminating all foods with gluten from your diet permanently. This requires some significant changes but is very manageable. WHAT DO I NEED TO KNOW ABOUT A GLUTEN-FREE DIET?  Look for items labeled with "GF." Looking for GF will make it easier to identify products that are safe to eat.  Read all labels. Gluten may have been added as a minor ingredient where least expected, such as in shredded cheeses or ice creams. Always check food labels and investigate questionable ingredients. Talk to your dietitian or health care provider if you have questions about certain foods or need help finding GF foods.  Check when in doubt. If you are not sure whether an ingredient contains gluten, check with the manufacturer. Note that some manufacturers may change ingredients without notice. Always read labels.   Know how food is prepared. Since flour and cereal products are often used in the preparation of foods, it is important to be aware of the methods of preparation used, as well as the ingredients in the foods themselves. This is especially true when you are dining out. Ask restaurants if they have a gluten-free menu.  Watch for cross-contamination. Cross-contamination occurs when  gluten-free foods come into contact with foods that contain gluten. It often happens during the manufacturing process. Always check the ingredient list and for warnings on packages, such as "may contain gluten."  Eat a balanced diet. It is important to still get enough fiber, iron, and B vitamins in your diet. Look for enriched whole grain gluten-free products and continue to eat a well-balanced diet of the important non-grain items, such as vegetables, fruit, lean proteins, legumes, and dairy.  Consider taking a gluten-free multivitamin and mineral supplement. Discuss this with your health care provider. WHAT KEY WORDS HELP IDENTIFY GLUTEN? Know key words to help identify gluten. A dietitian can help you identify possible harmful ingredients in the foods you normally eat. Words to check for on food labels include:   Flour, enriched flour, bromated flour, white flour, durum flour, graham flour, phosphated flour, self-rising flour, semolina, or farina.  Starch, dextrin, modified food starch, or cereal.  Thickening, fillers, or emulsifiers.  Any kind of malt flavoring, extract, or syrup (malt is made from barley and includes malt vinegar, malted milk, and malted beverages).  Hydrolyzed vegetable protein. WHAT FOODS CAN I EAT? Below is a list of common foods that are allowed with a gluten-free diet.  Grains Products made from the following flours or grains:amaranth,bean flours, 100% buckwheat flour, corn, millet, nut flours or meals, GF oats, quinoa, rice, sorghum, teff, any all-purpose 100% GF flour mix, rice wafers, pure cornmeal tortillas, popcorn, some crackers, some chips, and hot cereals made from cornmeal. Ask your dietitian which specific hot and cold cereals  are allowed. Hominy, rice or wild rice, and special GF pasta. Some Asian rice noodles or bean noodles. Arrowroot starch, corn bran, corn flour, corn germ, cornmeal, corn starch, potato flour, potato starch flour, and rice bran. Rice  flours: plain, brown, and sweet. Rice polish, soy flour, tapioca starch. Vegetables All plain, fresh, frozen, or canned vegetables.  Fruits All fresh, frozen, canned, dried fruits, and fruit juices.  Meats and Other Protein Foods Meat, fish, poultry, or eggs prepared without added wheat, rye, barley, or triticale. Some luncheon meat and some frankfurters. Pure meat. All aged cheese, most processed cheese products, some cottage cheese, and some cream cheese. Dried beans, dried peas, and lentils.  Dairy Milk and yogurt made with allowed ingredients.  Beverages Coffee (regular or decaffeinated), tea, herbal tea (read label to be sure that no wheat flour has been added). Carbonated beverages and some root beers. Wine, sake, and distilled spirits, such as gin, vodka, and whiskey. GF beers and GF ciders.  Sweetsand Desserts Sugar, honey, some syrups, molasses, jelly, jam, plain hard candy, marshmallows, gumdrops, homemade candies free of wheat, rye, barley, or triticale. Coconut. Custard, some pudding mixes, and homemade puddings from cornstarch, rice, and tapioca. Gelatin desserts, sorbets, frozen ice pops, and sherbet. Cake, cookies, and other desserts prepared with allowed flours. Some commercial ice creams. Ask your dietitian about specific brands of dessert that are allowed.  Fats and Oils Butter, margarine, vegetable oil, sour cream not containing modified food starch, whipping cream, shortening, lard, cream, and some mayonnaise. Some commercial salad dressings. Peanut butter.  Other Homemade broth and soups made with allowed ingredients; some canned or frozen soups. Any other combination or prepared foods that do not contain gluten. Monosodium glutamate (MSG). Cider, rice, and wine vinegar. Baking soda and baking powder. Certain soy sauces (Tamari). Ask your dietitian about specific brands that are allowed. Nuts, coconut, chocolate, and pure cocoa powder. Salt, pepper, herbs, spices,  extracts, and food colorings. The items listed above may not be a complete list of allowed foods or beverages. Contact your dietitian for more options.  WHAT FOODS CAN I NOT EAT? Below is a list of common foods that are not allowed with a gluten-free diet.  Grains Barley, bran, bulgur, cracked wheat, graham, malt, matzo, wheat germ, and all wheat and rye cereals including spelt and kamut. Avoid cereals containing malt as a flavoring, such as rice cereal. Also avoid regular noodles, spaghetti, macaroni, and most packaged rice mixes, and all others containing wheat, rye, barley, or triticale.  Vegetables Most creamed vegetables, most vegetables canned in sauces, and any vegetables prepared with wheat, rye, barley, or triticale.  Fruits Thickened or prepared fruits and some pie fillings.  Meats and Other Protein Sources Any meat or meat alternative containing wheat, rye, barley, or gluten stabilizers (such as some hot dogs, salami, cold cuts, or sausage). Bread-containing products, such as Swiss steak, croquettes, and meatloaf. Most tuna canned in vegetable broth, [place] with hydrolyzed vegetable protein (HVP) injected as part of the basting, and any cheese product containing oat gum as an ingredient. Seitan. Imitation fish. Dairy Commercial chocolate milk, which may have cereal added, and malted milk. Beverages Certain cereal beverages. Beer and ciders (unless GF), ale, malted milk, and some root beers. Sweetsand Desserts Commercial candies containing wheat, rye, barley, or triticale. Certain toffees are dusted with wheat flour. Chocolate-coated nuts, which are often rolled in flour. Cakes, cookies, doughnuts, and pastries that are prepared with wheat, barley, rye, or triticale flour. Some commercial ice creams,  ice cream flavors which contain cookies, crumbs, or cheesecake. Ice cream cones. Commercially prepared mixes for cakes, cookies, and other desserts unless marked GF. Bread pudding and  other puddings thickened with flour. Fats and Oils Some commercial salad dressings and sour cream containing modified food starch.  Condiments Some curry powder, some dry seasoning mixes, some gravy extracts, some meat sauces, some ketchup, some prepared mustard, horseradish. Other All soups containing wheat, rye, barley, or triticale flour. Bouillon and bouillon cubes that contain HVP. Combination or prepared foods that contain gluten. Some soy sauce, some chip dips, and some chewing gum. Yeast extract (contains barley). Caramel color (may contain malt). The items listed above may not be a complete list of foods and beverages to avoid. Contact your dietitian for more information. Document Released: 10/02/2005 Document Revised: 02/16/2014 Document Reviewed: 08/06/2013 [facility] Patient Information 2015 [place], [place]. This information is not intended to replace advice given to you by your health care provider. Make sure you discuss any questions you have with your health care provider.   Celiac Disease Celiac disease is a digestive disease that causes your body's natural defense system (immune system) to react against its own cells. It interferes with taking in (absorbing) nutrients from food. Celiac disease is also known as celiac sprue, nontropical sprue, and gluten-sensitive enteropathy. People who have celiac disease cannot tolerate gluten. Gluten is a protein found in wheat, rye, and barley. With time, celiac disease will damage the cells lining the small intestine. This leads to being unable to absorb nutrients from food (malabsorption), diarrhea, and nutritional problems. CAUSES  Celiac disease is genetic. This means you have a higher likelihood of getting the disease if someone in your family has or has had it. Up to 10% of your close relatives (parent, sibling, child) may also have the disease.  People with celiac disease tend to have other autoimmune diseases. The link may be genetic.  These diseases include:  Dermatitis herpetiformis.  Thyroid disease.  Systemic lupus erythematosus.  Type 1 diabetes.  Liver disease.  Collagen vascular disease.  Rheumatoid arthritis.  Sjogren syndrome. SYMPTOMS  The symptoms of celiac disease vary from person to person. The symptoms are generally digestive or nutritional. Digestive symptoms include:  Recurring belly (abdominal) bloating and pain.  Gas.  Long-term (chronic) diarrhea.  Pale, bad-smelling, greasy, or oily stool. Nutritional symptoms include:  Failure to thrive in infants.  Delayed growth in children.  Weight loss in children and adults.  Missed menstrual periods (often due to extreme weight loss).  Anemia.  Weakening bones (osteoporosis).  Fatigue and weakness.  Tingling or other signs of nerve damage (peripheral neuropathy).  Depression. DIAGNOSIS  If your symptoms and physical exam suggest that a digestive disorder or malnutrition is present, your caregiver may suspect celiac disease. You may have already begun a gluten-free diet. If symptoms persist, testing may be needed to confirm the diagnosis. Some tests are best done while you are on a normal, unrestricted diet. Tests may include:  Blood tests to check for nutritional deficiencies.  Blood tests to look for evidence that the body is producing antibodies against its own small intestine cells.  Taking a tissue sample (biopsy) from the small bowel for evaluation.  X-rays of the small bowel.  Evaluating the stool for fat.  Tests to check for nutrient absorption from the intestine. TREATMENT  It is important to seek treatment. Untreated celiac disease can cause growth problems (in children), anemia, osteoporosis, and possible nerve problems. A pregnant patient with untreated  celiac disease has a higher risk of miscarriage, and the fetus has an increased risk of low birth weight and other growth problems. If celiac disease is diagnosed in  the early stages, treatment can allow you to live a long, nearly symptom-free life. Treatment includes following a gluten-free diet. This means avoiding all foods that contain gluten. Eating even a small amount of gluten can damage your intestine. For most people, following this diet will stop symptoms. It will heal existing intestinal damage and prevent further damage. Improvements begin within days of starting the diet. The small intestine is usually completely healed within 3 to 6 months, or it may take up to 2 years for older adults. A small percentage of people do not improve on the gluten-free diet. Depending on your age and the stage at which you were diagnosed, some problems such as delayed growth and discolored teeth may not improve. Sometimes, damaged intestines cannot heal. If your intestines are not absorbing enough nutrients, you may need to receive nutrition supplements through an intravenous (IV) tube. Drug treatments are being tested for unresponsive celiac disease. In this case, you may need to be evaluated for complications of the disease. Your caregiver may also recommend:  A pneumonia vaccination.  Nutrients and other treatments for any nutritional deficiencies. Your caregiver can provide you with more information on a gluten-free diet. Discussion with a dietitian skilled in this illness will be valuable. Support groups may also be helpful. HOME CARE INSTRUCTIONS   Focus on a gluten-free diet. This diet must become a way of life.  Monitor your response to the gluten-free diet and treat any nutritional deficiencies.  Prepare ahead of time if you decide to eat outside the home.  Make and keep your regular follow-up visits with your caregiver.  Suggest to family members that they get screened for early signs of the disease. SEEK MEDICAL CARE IF:   You continue to have digestive symptoms (gas, cramping, diarrhea) despite a proper diet.  You have trouble sticking to the  gluten-free diet.  You develop an itchy rash with groups of tiny blisters.  You develop severe weakness, balance problems, menstrual problems, or depression. Document Released: 10/02/2005 Document Revised: 02/16/2014 Document Reviewed: 01/19/2010 [facility] Patient Information 2015 [place], [place]. This information is not intended to replace advice given to you by your health care provider. Make sure you discuss any questions you have with your health care provider.

## 2014-10-15 NOTE — Progress Notes (Signed)
   Subjective:    Patient ID: Ashley Bond, female    DOB: 1991-01-14, 23 y.o.   MRN: [id number]  HPI The patient is a 23 YO female who comes in to establish care today. She has had problems with abdominal pains and bloating over the years. She has also had alternating diarrhea and constipation but denies blood in her stools. She denies any chest pains. 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 Denies joint pain or swelling. Denies depression or anxiety but had some symptoms and took prozac while she was in graduate school. 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 She stopped gluten about 1-2 weeks ago and is feeling much better with her stomach and wonders if we can do a celiac test.   Review of Systems  Constitutional: Negative for fever, activity change, appetite change, fatigue and unexpected weight change.  HENT: Negative.   Respiratory: Negative for cough, chest tightness, shortness of breath and wheezing.   Cardiovascular: Negative for chest pain, palpitations and leg swelling.  Gastrointestinal: Positive for abdominal pain, diarrhea, constipation and abdominal distention.       Intermittent, not bad now.  Genitourinary: Negative.   Musculoskeletal: Negative.   Skin: Negative.   Neurological: Negative.   Psychiatric/Behavioral: Negative.       Objective:   Physical Exam  Constitutional: She is oriented to person, place, and time. She appears well-developed and well-nourished.  HENT:  Head: Normocephalic and atraumatic.  Eyes: EOM are normal.  Neck: Normal range of motion. No thyromegaly present.  Cardiovascular: Normal rate and regular rhythm.   No murmur heard. Pulmonary/Chest: Effort normal and breath sounds normal. No respiratory distress. She has no wheezes. She has no rales.  Abdominal: Soft. Bowel sounds are normal. She exhibits no distension. There is no tenderness. There is no rebound.  Musculoskeletal: She  exhibits no edema.  Neurological: She is alert and oriented to person, place, and time. Coordination normal.  Skin: Skin is warm and dry.   Filed Vitals:   10/15/14 1034  BP: 120/72  Pulse: 97  Temp: 98.2 F (36.8 C)  TempSrc: Oral  Resp: 12  Height: 5\' 5"  (1.651 m)  Weight: 178 lb (80.74 kg)  SpO2: 97%      Assessment & Plan:

## 2014-10-19 LAB — TISSUE TRANSGLUTAMINASE, IGA: Tissue Transglutaminase Ab, IgA: 1 U/mL (ref <4)

## 2014-10-19 LAB — GLIADIN ANTIBODIES, SERUM
GLIADIN IGG: 2 U (ref <20)
Gliadin IgA: 3 Units (ref <20)

## 2014-10-20 LAB — RETICULIN ANTIBODIES, IGA W TITER: Reticulin Ab, IgA: NEGATIVE

## 2014-10-22 ENCOUNTER — Encounter: Payer: Self-pay | Admitting: Internal Medicine

## 2014-11-30 ENCOUNTER — Ambulatory Visit (INDEPENDENT_AMBULATORY_CARE_PROVIDER_SITE_OTHER): Payer: BLUE CROSS/BLUE SHIELD | Admitting: Nurse Practitioner

## 2014-11-30 ENCOUNTER — Encounter: Payer: Self-pay | Admitting: Nurse Practitioner

## 2014-11-30 VITALS — BP 124/76 | HR 82 | Temp 98.7°F | Ht 65.0 in | Wt 179.2 lb

## 2014-11-30 DIAGNOSIS — L409 Psoriasis, unspecified: Secondary | ICD-10-CM

## 2014-11-30 DIAGNOSIS — J309 Allergic rhinitis, unspecified: Secondary | ICD-10-CM | POA: Insufficient documentation

## 2014-11-30 MED ORDER — TRIAMCINOLONE ACETONIDE 0.1 % EX CREA: 1.0000 "application " | TOPICAL_CREAM | Freq: Two times a day (BID) | CUTANEOUS | Status: DC

## 2014-11-30 MED ORDER — FLUTICASONE PROPIONATE 50 MCG/ACT NA SUSP: 1.0000 | Freq: Two times a day (BID) | NASAL | Status: DC

## 2014-11-30 NOTE — Progress Notes (Signed)
Subjective:     Ashley Bond is a 24 y.o. female who presents for evaluation of a rash & nasal congestion.  Rash involving the elbow, leg and buttocks, ankles. Rash started several years ago. 2 types rash: raised pink pruritic patches on extensor elbows & ankles; small "bumps" on thighs & buttocks. She uses brush & exfoliating body wash without relief. Rash has not changed over time. Associated symptoms: arthralgia and diarrhea.Patient has not had new exposures (soaps, lotions, laundry detergents, foods, medications, plants, insects or animals). Nasal congestion: duration 1 week. Associated w/HA, stuffy ears, sore throat. Used mucinex D w/relief, but makes her jittery. Uses saline spray daily & occasional zyrtec, but makes her sleepy. She has Hx of recurrent sinus congestion. Hx of diarrhea, bloating alternating w/constipation. Recent Hx of wrist pain. Remote Hx of joint pain in hands & feet.  The following portions of the patient's history were reviewed and updated as appropriate: allergies, current medications, past medical history, past social history, past surgical history and problem list.  Review of Systems Constitutional: negative for fatigue and fevers Respiratory: negative for cough Gastrointestinal: negative for dyspepsia and nausea Musculoskeletal:positive for recurrent knee pain    Objective:    BP 124/76 mmHg  Pulse 82  Temp(Src) 98.7 F (37.1 C) (Oral)  Ht 5\' 5"  (1.651 m)  Wt 179 lb 4 oz (81.307 kg)  BMI 29.83 kg/m2  SpO2 65% General:  alert, cooperative, appears stated age and no distress  Skin:  Heart: Lungs: ENT: Eyes:  pink scaly patch bilat elbows, extensor surface. Legs-dilated pink hair follicles, some raised.  RRR, no murmur Clear bilat BS nml posterior pharynx, bilat TM nml, pale nasal mucosa, scant white d/c Lids & lashes clear, wearing glasses     Assessment:Plan      1. Psoriasis Concern for psoriatic arthritis, possibly with enteropathy, given  history  - triamcinolone cream (KENALOG) 0.1 %; Apply 1 application topically 2 (two) times daily. Use sparingly as can cause bleaching and atrophy.  Dispense: 30 g; Refill: 0 Pt is to f/u in 2 weeks Consider rheum referral  2. Allergic rhinitis, unspecified allergic rhinitis type Daily sinus rinse w/bottle, not netty pot Consider dymista if no improvement - fluticasone (FLONASE) 50 MCG/ACT nasal spray; Place 1 spray into both nostrils 2 (two) times daily.  Dispense: 16 g; Refill: 6

## 2014-11-30 NOTE — Patient Instructions (Addendum)
Start steroid cream on patches of psoriasis. Use twice daily. Skin care: no liquid soaps. Dove bar soap only. Use lotion within 3  Minutes of getting out of shower/tub. Avoid super hot water temps as it makes psoriasis worse. Use exfoliating glove on legs when bathing & follow w/moisturizer as stated above. Start daily sinus rinse w/Neilmed sinus rinse. Use flonase twice daily for at least 10 days. Follow up w/Dr Doug Sou in 2 weeks.   Psoriasis Psoriasis is a common, long-lasting (chronic) inflammation of the skin. It affects both men and women equally, of all ages and all races. Psoriasis cannot be passed from person to person (not contagious). Psoriasis varies from mild to very severe. When severe, it can greatly affect your quality of life. Psoriasis is an inflammatory disorder affecting the skin as well as other organs including the joints (causing an arthritis). With psoriasis, the skin sheds its top layer of cells more rapidly than it does in someone without psoriasis. CAUSES  The cause of psoriasis is largely unknown. Genetics, your immune system, and the environment seem to play a role in causing psoriasis. Factors that can make psoriasis worse include:  Damage or trauma to the skin, such as cuts, scrapes, and sunburn. This damage often causes new areas of psoriasis (lesions).  Winter dryness and lack of sunlight.  Medicines such as lithium, beta-blockers, antimalarial drugs, ACE inhibitors, nonsteroidal anti-inflammatory drugs (ibuprofen, aspirin), and terbinafine. Let your caregiver know if you are taking any of these drugs.  Alcohol. Excessive alcohol use should be avoided if you have psoriasis. Drinking large amounts of alcohol can affect:  How well your psoriasis treatment works.  How safe your psoriasis treatment is.  Smoking. If you smoke, ask your caregiver for help to quit.  Stress.  Bacterial or viral infections.  Arthritis. Arthritis associated with psoriasis  (psoriatic arthritis) affects less than 10% of patients with psoriasis. The arthritic intensity does not always match the skin psoriasis intensity. It is important to let your caregiver know if your joints hurt or if they are stiff. SYMPTOMS  The most common form of psoriasis begins with little red bumps that gradually become larger. The bumps begin to form scales that flake off easily. The lower layers of scales stick together. When these scales are scratched or removed, the underlying skin is tender and bleeds easily. These areas then grow in size and may become large. Psoriasis often creates a rash that looks the same on both sides of the body (symmetrical). It often affects the elbows, knees, groin, genitals, arms, legs, scalp, and nails. Affected nails often have pitting, loosen, thicken, crumble, and are difficult to treat.  "Inverse psoriasis"occurs in the armpits, under breasts, in skin folds, and around the groin, buttocks, and genitals.  "Guttate psoriasis" generally occurs in children and young adults following a recent sore throat (strep throat). It begins with many small, red, scaly spots on the skin. It clears spontaneously in weeks or a few months without treatment. DIAGNOSIS  Psoriasis is diagnosed by physical exam. A tissue sample (biopsy) may also be taken. TREATMENT The treatment of psoriasis depends on your age, health, and living conditions.  Steroid (cortisone) creams, lotions, and ointments may be used. These treatments are associated with thinning of the skin, blood vessels that get larger (dilated), loss of skin pigmentation, and easy bruising. It is important to use these steroids as directed by your caregiver. Only treat the affected areas and not the normal, unaffected skin. People on long-term steroid treatment  should wear a medical alert bracelet. Injections may be used in areas that are difficult to treat.  Scalp treatments are available as shampoos, solutions, sprays,  foams, and oils. Avoid scratching the scalp and picking at the scales.  Anthralin medicine works well on areas that are difficult to treat. However, it stains clothes and skin and may cause temporary irritation.  Synthetic vitamin D (calcipotriene)can be used on small areas. It is available by prescription. The forms of synthetic vitamin D available in health food stores do not help with psoriasis.  Coal tarsare available in various strengths for psoriasis that is difficult to treat. They are one of the longest used treatments for difficult to treat psoriasis. However, they are messy to use.  Light therapy (UV therapy) can be carefully and professionally monitored in a dermatologist's office. Careful sunbathing is helpful for many people as directed by your caregiver. The exposure should be just long enough to cause a mild redness (erythema) of your skin. Avoid sunburn as this may make the condition worse. Sunscreen (SPF of 30 or higher) should be used to protect against sunburn. Cataracts, wrinkles, and skin aging are some of the harmful side effects of light therapy.  If creams (topical medicines) fail, there are several other options for systemic or oral medicines your caregiver can suggest. Psoriasis can sometimes be very difficult to treat. It can come and go. It is necessary to follow up with your caregiver regularly if your psoriasis is difficult to treat. Usually, with persistence you can get a good amount of relief. Maintaining consistent care is important. Do not change caregivers just because you do not see immediate results. It may take several trials to find the right combination of treatment for you. PREVENTING FLARE-UPS  Wear gloves while you wash dishes, while cleaning, and when you are outside in the cold.  If you have radiators, place a bowl of water or damp towel on the radiator. This will help put water back in the air. You can also use a humidifier to keep the air moist. Try to  keep the humidity at about 60% in your home.  Apply moisturizer while your skin is still damp from bathing or showering. This traps water in the skin.  Avoid long, hot baths or showers. Keep soap use to a minimum. Soaps dry out the skin and wash away the protective oils. Use a fragrance free, dye free soap.  Drink enough water and fluids to keep your urine clear or pale yellow. Not drinking enough water depletes your skin's water supply.  Turn off the heat at night and keep it low during the day. Cool air is less drying. SEEK MEDICAL CARE IF:  You have increasing pain in the affected areas.  You have uncontrolled bleeding in the affected areas.  You have increasing redness or warmth in the affected areas.  You start to have pain or stiffness in your joints.  You start feeling depressed about your condition.  You have a fever. Document Released: 09/29/2000 Document Revised: 12/25/2011 Document Reviewed: 03/27/2011 [facility] Patient Information 2015 [place], [place]. This information is not intended to replace advice given to you by your health care provider. Make sure you discuss any questions you have with your health care provider.

## 2014-11-30 NOTE — Progress Notes (Signed)
Pre visit review using our clinic review tool, if applicable. No additional management support is needed unless otherwise documented below in the visit note. 

## 2014-12-07 ENCOUNTER — Telehealth: Payer: Self-pay | Admitting: Internal Medicine

## 2014-12-07 NOTE — Telephone Encounter (Signed)
States when she last seen Ashley Bond suggested a rheumatologist. Patient is requesting a referral.

## 2014-12-07 NOTE — Telephone Encounter (Signed)
I discussed rheum ref w/pt, but suggested she have f/u visit, first, to see how rash responds to treatment.  I am happy to see her as f/u of rash & make referral at that time, if indicated. She had appt set up w/Dr Doug Sou 1st week of march, but it has been cancelled. Pls schedule f/u w/me, if OK w/DR Doug Sou & pt agrees.

## 2014-12-08 NOTE — Telephone Encounter (Signed)
Got scheduled for fu with [facility]

## 2014-12-10 ENCOUNTER — Encounter: Payer: Self-pay | Admitting: Internal Medicine

## 2014-12-10 ENCOUNTER — Ambulatory Visit (INDEPENDENT_AMBULATORY_CARE_PROVIDER_SITE_OTHER): Payer: BLUE CROSS/BLUE SHIELD | Admitting: Internal Medicine

## 2014-12-10 VITALS — BP 122/76 | HR 71 | Temp 98.3°F | Resp 16 | Wt 177.0 lb

## 2014-12-10 DIAGNOSIS — M2550 Pain in unspecified joint: Secondary | ICD-10-CM

## 2014-12-10 DIAGNOSIS — L409 Psoriasis, unspecified: Secondary | ICD-10-CM

## 2014-12-10 MED ORDER — ALBUTEROL SULFATE HFA 108 (90 BASE) MCG/ACT IN AERS: 1.0000 | INHALATION_SPRAY | Freq: Four times a day (QID) | RESPIRATORY_TRACT | Status: DC | PRN

## 2014-12-10 NOTE — Progress Notes (Signed)
Pre visit review using our clinic review tool, if applicable. No additional management support is needed unless otherwise documented below in the visit note. 

## 2014-12-10 NOTE — Progress Notes (Signed)
   Subjective:    Patient ID: Ashley Bond, female    DOB: [birth year], 24 y.o.   MRN: [id number]  HPI The patient is a 24 YO female who is coming in about joint pain and rash. She has been told she has psoriasis on her arm within the last 2-3 years. She has been given a cream for it which has been mildly effective. It has been stable over that time. She has also noticed in the last several months some associated joint pain in her hands and arms. She was originally told it was carpal tunnel and tried a wrist brace without much improvement (the fit was not good). The last doctor told her it may be related and she should see a rheumatologist. She denies fevers, chills, weight loss. She also has been researching psoriatic arthritis on the internet and thinks that her stomach pains fit as well.   Review of Systems  Constitutional: Negative for fever, activity change, appetite change, fatigue and unexpected weight change.  HENT: Negative.   Respiratory: Negative for cough, chest tightness, shortness of breath and wheezing.   Cardiovascular: Negative for chest pain, palpitations and leg swelling.  Gastrointestinal: Positive for abdominal pain.       Intermittent, not bad now.  Genitourinary: Negative.   Musculoskeletal: Positive for arthralgias.  Skin: Positive for rash.  Neurological: Negative.   Psychiatric/Behavioral: Negative.       Objective:   Physical Exam  Constitutional: She is oriented to person, place, and time. She appears well-developed and well-nourished.  HENT:  Head: Normocephalic and atraumatic.  Eyes: EOM are normal.  Neck: Normal range of motion. No thyromegaly present.  Cardiovascular: Normal rate and regular rhythm.   No murmur heard. Pulmonary/Chest: Effort normal and breath sounds normal. No respiratory distress. She has no wheezes. She has no rales.  Abdominal: Soft. Bowel sounds are normal. She exhibits no distension. There is no tenderness. There is no rebound.    Musculoskeletal: She exhibits no edema.  Neurological: She is alert and oriented to person, place, and time. Coordination normal.  Skin: Skin is warm and dry.  Mild rash on the elbow left arm   Filed Vitals:   12/10/14 1538  BP: 122/76  Pulse: 71  Temp: 98.3 F (36.8 C)  TempSrc: Oral  Resp: 16  Weight: 177 lb (80.287 kg)  SpO2: 97%      Assessment & Plan:

## 2014-12-10 NOTE — Assessment & Plan Note (Signed)
Cream only mildly effective. Rash not bad on exam. Refer to rheumatology per patient request.

## 2014-12-10 NOTE — Patient Instructions (Signed)
We have sent in the albuterol and the referral for Dr. Trudie Reed of rheumatology. She will do a thorough job to make sure she finds the problem.  Call us back if you have not heard in 1-2 weeks.

## 2014-12-16 ENCOUNTER — Ambulatory Visit: Payer: BLUE CROSS/BLUE SHIELD | Admitting: Internal Medicine

## 2015-07-26 ENCOUNTER — Ambulatory Visit (INDEPENDENT_AMBULATORY_CARE_PROVIDER_SITE_OTHER): Payer: BLUE CROSS/BLUE SHIELD | Admitting: Physician Assistant

## 2015-07-26 ENCOUNTER — Encounter: Payer: Self-pay | Admitting: Physician Assistant

## 2015-07-26 VITALS — BP 114/84 | HR 79 | Temp 98.8°F | Resp 16 | Ht 66.0 in | Wt 168.4 lb

## 2015-07-26 DIAGNOSIS — J019 Acute sinusitis, unspecified: Secondary | ICD-10-CM

## 2015-07-26 DIAGNOSIS — R062 Wheezing: Secondary | ICD-10-CM | POA: Diagnosis not present

## 2015-07-26 MED ORDER — AMOXICILLIN-POT CLAVULANATE 875-125 MG PO TABS: 1.0000 | ORAL_TABLET | Freq: Two times a day (BID) | ORAL | Status: DC

## 2015-07-26 MED ORDER — ALBUTEROL SULFATE HFA 108 (90 BASE) MCG/ACT IN AERS: 1.0000 | INHALATION_SPRAY | Freq: Four times a day (QID) | RESPIRATORY_TRACT | Status: AC | PRN

## 2015-07-26 NOTE — Progress Notes (Signed)
[facility] [address], [place] [address] [phone]  Date:  07/26/2015   Name:  Ashley Bond   DOB:  March 18, 1991   MRN:  [id number]  PCP:  Hoyt Koch, MD    History of Present Illness:  Ashley Bond is a 24 y.o. female patient who presents to [facility] sinus pressure, HA, rhinorrhea, coughing and bodyaches.  Initial sxs began 2 weeks ago, and appeared to resolve within the week.  She then felt the return of considerable nasal pressure, and eye pain, coughing.  Coughing is worse at night with what post nasal drip.  She is doing nasal irrigation, which works very little.   She reports some wheezing with sob and dyspnea.  Chest feels tight.  Productive sputum of clear yellow.      Patient Active Problem List   Diagnosis Date Noted  . Rhinitis, allergic 11/30/2014  . Psoriasis 11/30/2014  . Family history of hypothyroidism 07/31/2014  . Abdominal pain, diffuse 09/03/2013  . Allergic rhinitis 03/12/2013  . Physical exam, annual 06/19/2012    Past Medical History  Diagnosis Date  . Chicken pox   . Anxiety   . Allergy   . Asthma     exercise induced    Past Surgical History  Procedure Laterality Date  . Tonsillectomy    . Tonsillectomy and adenoidectomy  1997  . Fracture surgery      Social History  Substance Use Topics  . Smoking status: Former Smoker    Types: Cigarettes    Quit date: 10/31/2012  . Smokeless tobacco: None     Comment: 1/2 PACK X 1 WEEK   . Alcohol Use: Yes     Comment: A COUPLE DRINKS NIGHTLY     Family History  Problem Relation Age of Onset  . Hyperlipidemia Mother   . Polycystic ovary syndrome Mother   . Breast cancer Maternal Aunt   . Breast cancer Maternal Grandmother   . Cancer Maternal Grandmother     breast  . Breast cancer Paternal Grandmother   . Arthritis Paternal Grandfather   . Cancer Paternal Grandfather     breast  . Heart disease Father   . Sleep apnea Father   . Sleep apnea Brother      Allergies  Allergen Reactions  . Shellfish Allergy   . Pseudoephedrine Hcl Er   . Soy Allergy     Medication list has been reviewed and updated.  Current Outpatient Prescriptions on File Prior to Visit  Medication Sig Dispense Refill  . etonogestrel-ethinyl estradiol (NUVARING) 0.12-0.015 MG/24HR vaginal ring Place 1 each vaginally every 28 (twenty-eight) days. Insert vaginally and leave in place for 3 consecutive weeks, then remove for 1 week.    Marland Kitchen ibuprofen (ADVIL,MOTRIN) 200 MG tablet Take 200 mg by mouth every 6 (six) hours as needed.    Marland Kitchen albuterol (PROVENTIL HFA;VENTOLIN HFA) 108 (90 BASE) MCG/ACT inhaler Inhale 1-2 puffs into the lungs every 6 (six) hours as needed for wheezing or shortness of breath. (Patient not taking: Reported on 07/26/2015) 6.7 g 2  . EPINEPHrine (EPIPEN) 0.3 mg/0.3 mL DEVI Inject 0.3 mg into the muscle once.    . fluticasone (FLONASE) 50 MCG/ACT nasal spray Place 1 spray into both nostrils 2 (two) times daily. (Patient not taking: Reported on 07/26/2015) 16 g 6   No current facility-administered medications on file prior to visit.    ROS ROS otherwise unremarkable unless listed above.  Physical Examination: BP 114/84 mmHg  Pulse 79  Temp(Src) 98.8 F (37.1 C) (Oral)  Resp 16  Ht 5\' 6"  (1.676 m)  Wt 168 lb 6.4 oz (76.386 kg)  BMI 27.19 kg/m2  SpO2 99%  LMP 07/15/2015 Ideal Body Weight: Weight in (lb) to have BMI = 25: 154.6  Physical Exam  Constitutional: She is oriented to person, place, and time. She appears well-developed and well-nourished. No distress.  HENT:  Head: Normocephalic and atraumatic.  Right Ear: Tympanic membrane, external ear and ear canal normal.  Left Ear: Tympanic membrane, external ear and ear canal normal.  Nose: Mucosal edema and rhinorrhea present. Right sinus exhibits no maxillary sinus tenderness and no frontal sinus tenderness. Left sinus exhibits no maxillary sinus tenderness and no frontal sinus tenderness.   Mouth/Throat: No uvula swelling. Posterior oropharyngeal erythema (mild) present. No oropharyngeal exudate or posterior oropharyngeal edema.  Eyes: Conjunctivae and EOM are normal. Pupils are equal, round, and reactive to light.  Cardiovascular: Normal rate and regular rhythm.  Exam reveals no gallop, no distant heart sounds and no friction rub.   No murmur heard. Pulmonary/Chest: Effort normal. No respiratory distress. She has no decreased breath sounds. She has no wheezes. She has no rhonchi.  Lymphadenopathy:       Head (right side): No submandibular, no tonsillar, no preauricular and no posterior auricular adenopathy present.       Head (left side): No submandibular, no tonsillar, no preauricular and no posterior auricular adenopathy present.  Neurological: She is alert and oriented to person, place, and time.  Skin: She is not diaphoretic.  Psychiatric: She has a normal mood and affect. Her behavior is normal.     Assessment and Plan: Ashley Bond is a 24 y.o. female who is here today is here today for chief complaint of sore throat nasal congestion and pressure, and productive cough. Treating for sinusitis.  This will cover for cap at this time.  Albuterol refilled.      Subacute sinusitis, unspecified location - Plan: amoxicillin-clavulanate (AUGMENTIN) 875-125 MG tablet, albuterol (PROVENTIL HFA;VENTOLIN HFA) 108 (90 BASE) MCG/ACT inhaler  Wheezing - Plan: albuterol (PROVENTIL HFA;VENTOLIN HFA) 108 (90 BASE) MCG/ACT inhaler  Ivar Drape, PA-C [facility] 07/26/2015 4:03 PM

## 2015-07-26 NOTE — Patient Instructions (Signed)
Please take antibiotic to completion. Continue nasal irrigation, and please take the mucinex 1200mg  every 12 hours.  Please remember to hydrate as much as possible.   Sinusitis, Adult Sinusitis is redness, soreness, and inflammation of the paranasal sinuses. Paranasal sinuses are air pockets within the bones of your face. They are located beneath your eyes, in the middle of your forehead, and above your eyes. In healthy paranasal sinuses, mucus is able to drain out, and air is able to circulate through them by way of your nose. However, when your paranasal sinuses are inflamed, mucus and air can become trapped. This can allow bacteria and other germs to grow and cause infection. Sinusitis can develop quickly and last only a short time (acute) or continue over a long period (chronic). Sinusitis that lasts for more than 12 weeks is considered chronic. CAUSES Causes of sinusitis include:  Allergies.  Structural abnormalities, such as displacement of the cartilage that separates your nostrils (deviated septum), which can decrease the air flow through your nose and sinuses and affect sinus drainage.  Functional abnormalities, such as when the small hairs (cilia) that line your sinuses and help remove mucus do not work properly or are not present. SIGNS AND SYMPTOMS Symptoms of acute and chronic sinusitis are the same. The primary symptoms are pain and pressure around the affected sinuses. Other symptoms include:  Upper toothache.  Earache.  Headache.  Bad breath.  Decreased sense of smell and taste.  A cough, which worsens when you are lying flat.  Fatigue.  Fever.  Thick drainage from your nose, which often is green and may contain pus (purulent).  Swelling and warmth over the affected sinuses. DIAGNOSIS Your health care provider will perform a physical exam. During your exam, your health care provider may perform any of the following to help determine if you have acute sinusitis  or chronic sinusitis:  Look in your nose for signs of abnormal growths in your nostrils (nasal polyps).  Tap over the affected sinus to check for signs of infection.  View the inside of your sinuses using an imaging device that has a light attached (endoscope). If your health care provider suspects that you have chronic sinusitis, one or more of the following tests may be recommended:  Allergy tests.  Nasal culture. A sample of mucus is taken from your nose, sent to a lab, and screened for bacteria.  Nasal cytology. A sample of mucus is taken from your nose and examined by your health care provider to determine if your sinusitis is related to an allergy. TREATMENT Most cases of acute sinusitis are related to a viral infection and will resolve on their own within 10 days. Sometimes, medicines are prescribed to help relieve symptoms of both acute and chronic sinusitis. These may include pain medicines, decongestants, nasal steroid sprays, or saline sprays. However, for sinusitis related to a bacterial infection, your health care provider will prescribe antibiotic medicines. These are medicines that will help kill the bacteria causing the infection. Rarely, sinusitis is caused by a fungal infection. In these cases, your health care provider will prescribe antifungal medicine. For some cases of chronic sinusitis, surgery is needed. Generally, these are cases in which sinusitis recurs more than 3 times per year, despite other treatments. HOME CARE INSTRUCTIONS  Drink plenty of water. Water helps thin the mucus so your sinuses can drain more easily.  Use a humidifier.  Inhale steam 3-4 times a day (for example, sit in the bathroom with the shower running).  Apply a warm, moist washcloth to your face 3-4 times a day, or as directed by your health care provider.  Use saline nasal sprays to help moisten and clean your sinuses.  Take medicines only as directed by your health care provider.  If  you were prescribed either an antibiotic or antifungal medicine, finish it all even if you start to feel better. SEEK IMMEDIATE MEDICAL CARE IF:  You have increasing pain or severe headaches.  You have nausea, vomiting, or drowsiness.  You have swelling around your face.  You have vision problems.  You have a stiff neck.  You have difficulty breathing.   This information is not intended to replace advice given to you by your health care provider. Make sure you discuss any questions you have with your health care provider.   Document Released: 10/02/2005 Document Revised: 10/23/2014 Document Reviewed: 10/17/2011 Elsevier Interactive Patient Education 2016 Elsevier Inc.  Amoxicillin; Clavulanic Acid tablets What is this medicine? AMOXICILLIN; CLAVULANIC ACID (a mox i SIL in; KLAV yoo lan ic AS id) is a penicillin antibiotic. It is used to treat certain kinds of bacterial infections. It will not work for colds, flu, or other viral infections. This medicine may be used for other purposes; ask your health care provider or pharmacist if you have questions. What should I tell my health care provider before I take this medicine? They need to know if you have any of these conditions: -bowel disease, like colitis -kidney disease -liver disease -mononucleosis -an unusual or allergic reaction to amoxicillin, penicillin, cephalosporin, other antibiotics, clavulanic acid, other medicines, foods, dyes, or preservatives -pregnant or trying to get pregnant -breast-feeding How should I use this medicine? Take this medicine by mouth with a full glass of water. Follow the directions on the prescription label. Take at the start of a meal. Do not crush or chew. If the tablet has a score line, you may cut it in half at the score line for easier swallowing. Take your medicine at regular intervals. Do not take your medicine more often than directed. Take all of your medicine as directed even if you think  you are better. Do not skip doses or stop your medicine early. Talk to your pediatrician regarding the use of this medicine in children. Special care may be needed. Overdosage: If you think you have taken too much of this medicine contact a poison control center or emergency room at once. NOTE: This medicine is only for you. Do not share this medicine with others. What if I miss a dose? If you miss a dose, take it as soon as you can. If it is almost time for your next dose, take only that dose. Do not take double or extra doses. What may interact with this medicine? -allopurinol -anticoagulants -birth control pills -methotrexate -probenecid This list may not describe all possible interactions. Give your health care provider a list of all the medicines, herbs, non-prescription drugs, or dietary supplements you use. Also tell them if you smoke, drink alcohol, or use illegal drugs. Some items may interact with your medicine. What should I watch for while using this medicine? Tell your doctor or health care professional if your symptoms do not improve. Do not treat diarrhea with over the counter products. Contact your doctor if you have diarrhea that lasts more than 2 days or if it is severe and watery. If you have diabetes, you may get a false-positive result for sugar in your urine. Check with your doctor or health  care professional. Birth control pills may not work properly while you are taking this medicine. Talk to your doctor about using an extra method of birth control. What side effects may I notice from receiving this medicine? Side effects that you should report to your doctor or health care professional as soon as possible: -allergic reactions like skin rash, itching or hives, swelling of the face, lips, or tongue -breathing problems -dark urine -fever or chills, sore throat -redness, blistering, peeling or loosening of the skin, including inside the mouth -seizures -trouble passing  urine or change in the amount of urine -unusual bleeding, bruising -unusually weak or tired -white patches or sores in the mouth or throat Side effects that usually do not require medical attention (report to your doctor or health care professional if they continue or are bothersome): -diarrhea -dizziness -headache -nausea, vomiting -stomach upset -vaginal or anal irritation This list may not describe all possible side effects. Call your doctor for medical advice about side effects. You may report side effects to FDA at 1-800-FDA-1088. Where should I keep my medicine? Keep out of the reach of children. Store at room temperature below 25 degrees C (77 degrees F). Keep container tightly closed. Throw away any unused medicine after the expiration date. NOTE: This sheet is a summary. It may not cover all possible information. If you have questions about this medicine, talk to your doctor, pharmacist, or health care provider.    2016, Elsevier/Gold Standard. (2007-12-26 12:04:30)

## 2015-07-27 ENCOUNTER — Encounter: Payer: Self-pay | Admitting: Internal Medicine

## 2015-07-27 DIAGNOSIS — Z87898 Personal history of other specified conditions: Secondary | ICD-10-CM

## 2015-08-19 ENCOUNTER — Ambulatory Visit (INDEPENDENT_AMBULATORY_CARE_PROVIDER_SITE_OTHER): Payer: BLUE CROSS/BLUE SHIELD | Admitting: Family Medicine

## 2015-08-19 VITALS — BP 144/78 | HR 91 | Temp 98.2°F | Resp 16 | Ht 66.5 in | Wt 164.2 lb

## 2015-08-19 DIAGNOSIS — M545 Low back pain, unspecified: Secondary | ICD-10-CM

## 2015-08-19 DIAGNOSIS — N3000 Acute cystitis without hematuria: Secondary | ICD-10-CM

## 2015-08-19 LAB — POCT URINALYSIS DIP (MANUAL ENTRY)
BILIRUBIN UA: NEGATIVE
Glucose, UA: NEGATIVE
Ketones, POC UA: NEGATIVE
NITRITE UA: NEGATIVE
PH UA: 6.5
Protein Ur, POC: NEGATIVE
UROBILINOGEN UA: 0.2

## 2015-08-19 LAB — POC MICROSCOPIC URINALYSIS (UMFC): Mucus: ABSENT

## 2015-08-19 MED ORDER — CIPROFLOXACIN HCL 500 MG PO TABS: 500.0000 mg | ORAL_TABLET | Freq: Two times a day (BID) | ORAL | Status: DC

## 2015-08-19 NOTE — Patient Instructions (Signed)

## 2015-08-19 NOTE — Progress Notes (Signed)
Subjective:  This chart was scribed for Robyn Haber, MD by Leandra Kern, Medical Scribe. This patient was seen in Room 10 and the patient's care was started at 6:48 PM.   Patient ID: Ashley Bond, female    DOB: 29-Jun-1991, 24 y.o.   MRN: [id number]  Chief Complaint  Patient presents with   Back Pain    dull   Abdominal Pain   Bladder Pressure    HPI HPI Comments: Ashley Bond is a 24 y.o. female who presents to [facility] complaining of a possible UTI, onset 3 days ago.   Pt notes a prior hx of UTI that presents with the similar symptoms, last onset more than 1 year ago. Pt indicates that the symptoms usually resolve by themselves after drinking much fluids. Pt reports with associated symptoms of lower back pain, nausea, urinary frequency, suprapubic dull abdominal pain. Pt denies subjective fever.  Pt was seen here on 10/10 and was diagnosed with sinusitis for which she was treated with antibiotics.    Patient Active Problem List   Diagnosis Date Noted   Rhinitis, allergic 11/30/2014   Psoriasis 11/30/2014   Family history of hypothyroidism 07/31/2014   Abdominal pain, diffuse 09/03/2013   Allergic rhinitis 03/12/2013   Physical exam, annual 06/19/2012   Past Medical History  Diagnosis Date   Chicken pox    Anxiety    Allergy    Asthma     exercise induced   Past Surgical History  Procedure Laterality Date   Tonsillectomy     Tonsillectomy and adenoidectomy  1997   Fracture surgery     Allergies  Allergen Reactions   Shellfish Allergy    Pseudoephedrine Hcl Er    Soy Allergy    Prior to Admission medications   Medication Sig Start Date End Date Taking? Authorizing Provider  albuterol (PROVENTIL HFA;VENTOLIN HFA) 108 (90 BASE) MCG/ACT inhaler Inhale 1-2 puffs into the lungs every 6 (six) hours as needed for wheezing or shortness of breath. 07/26/15  Yes Dorian Heckle English, PA  etonogestrel-ethinyl estradiol  (NUVARING) 0.12-0.015 MG/24HR vaginal ring Place 1 each vaginally every 28 (twenty-eight) days. Insert vaginally and leave in place for 3 consecutive weeks, then remove for 1 week.   Yes Historical Provider, MD  ibuprofen (ADVIL,MOTRIN) 200 MG tablet Take 200 mg by mouth every 6 (six) hours as needed.   Yes Historical Provider, MD   Social History   Social History   Marital Status: Single    Spouse Name: N/A   Number of Children: N/A   Years of Education: N/A   Occupational History   Not on file.   Social History Main Topics   Smoking status: Former Smoker    Types: Cigarettes    Quit date: 10/31/2012   Smokeless tobacco: Never Used     Comment: 1/2 PACK X 1 WEEK    Alcohol Use: 0.0 oz/week    0 Standard drinks or equivalent per week     Comment: A COUPLE DRINKS NIGHTLY    Drug Use: No   Sexual Activity: Yes    Birth Control/ Protection: Inserts   Other Topics Concern   Not on file   Social History Narrative    Review of Systems  Constitutional: Negative for fever.  Gastrointestinal: Positive for nausea and abdominal pain.  Genitourinary: Positive for frequency.  Musculoskeletal: Positive for back pain.       Objective:   Physical Exam  Constitutional: She is oriented to  person, place, and time. She appears well-developed and well-nourished. No distress.  HENT:  Head: Normocephalic and atraumatic.  Eyes: EOM are normal. Pupils are equal, round, and reactive to light.  Neck: Neck supple.  Cardiovascular: Normal rate.   Pulmonary/Chest: Effort normal.  Neurological: She is alert and oriented to person, place, and time. No cranial nerve deficit.  Skin: Skin is warm and dry.  Psychiatric: She has a normal mood and affect. Her behavior is normal.  Nursing note and vitals reviewed.   Results for orders placed or performed in visit on 08/19/15  POCT urinalysis dipstick  Result Value Ref Range   Color, UA light yellow (A) yellow   Clarity, UA hazy (A)  clear   Glucose, UA negative negative   Bilirubin, UA negative negative   Ketones, POC UA negative negative   Spec Grav, UA <=1.005    Blood, UA moderate (A) negative   pH, UA 6.5    Protein Ur, POC negative negative   Urobilinogen, UA 0.2    Nitrite, UA Negative Negative   Leukocytes, UA moderate (2+) (A) Negative  POCT Microscopic Urinalysis ([facility])  Result Value Ref Range   WBC,UR,HPF,POC Moderate (A) None WBC/hpf   RBC,UR,HPF,POC Few (A) None RBC/hpf   Bacteria Few (A) None, Too numerous to count   Mucus Absent Absent   Epithelial Cells, UR Per Microscopy Few (A) None, Too numerous to count cells/hpf      BP 144/78 mmHg   Pulse 91   Temp(Src) 98.2 F (36.8 C) (Oral)   Resp 16   Ht 5' 6.5" (1.689 m)   Wt 164 lb 4 oz (74.503 kg)   BMI 26.12 kg/m2   SpO2 98%   LMP 08/14/2015     Assessment & Plan:     By signing my name below, I, Rawaa Al Rifaie, attest that this documentation has been prepared under the direction and in the presence of Robyn Haber, MD.  Leandra Kern, Medical Scribe. 08/19/2015.  6:54 PM. This chart was scribed in my presence and reviewed by me personally.    ICD-9-CM ICD-10-CM   1. Bilateral low back pain without sciatica 724.2 M54.5 POCT urinalysis dipstick     POCT Microscopic Urinalysis ([facility])     ciprofloxacin (CIPRO) 500 MG tablet  2. Acute cystitis without hematuria 595.0 N30.00 ciprofloxacin (CIPRO) 500 MG tablet     Signed, Robyn Haber, MD

## 2015-08-27 ENCOUNTER — Encounter: Payer: Self-pay | Admitting: Women's Health

## 2015-08-27 ENCOUNTER — Ambulatory Visit (INDEPENDENT_AMBULATORY_CARE_PROVIDER_SITE_OTHER): Payer: BLUE CROSS/BLUE SHIELD | Admitting: Women's Health

## 2015-08-27 VITALS — BP 130/80 | Ht 66.0 in | Wt 166.0 lb

## 2015-08-27 DIAGNOSIS — N6012 Diffuse cystic mastopathy of left breast: Secondary | ICD-10-CM | POA: Diagnosis not present

## 2015-08-27 DIAGNOSIS — R350 Frequency of micturition: Secondary | ICD-10-CM

## 2015-08-27 NOTE — Progress Notes (Signed)
Patient ID: Ashley Bond, female   DOB: 10/24/1990, 24 y.o.   MRN: [id number] Presents new patient with problem. Reports left breast has increased in size, bras no longer fit. No palpable changes in left breast, history of fibrocystic breasts. Denies change in routine, injury or caffeine consumption. States has lost approximately 10 pounds in the past few months. Strong family history of breast cancer maternal aunt age 32, maternal grandmother 85 and paternal grandmother unknown age. Regular monthly cycle on NuvaRing. Same partner for years marriage planned next month. Reports Gardasil series completed. Normal annual exam, Pap with negative STD screen July 2016. Treated for UTI 2 weeks ago. 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  Exam: Appears well. Heart regular rate and rhythm, lungs clear. Breast exam: Sitting and lying position, no dimpling, retractions, nipple discharge, no palpable nodules in left breast. Left breast is visibly larger than right. Right breast 2 1 cm nodules  at 11:00,  3:00, smooth, mobile mild tenderness.  Recent change in breast size left greater than right Right breast palpable nodules Monthly cycle on NuvaRing  Plan: Ultrasound both breasts, we'll get scheduled. Continue SBE's report changes.

## 2015-08-28 LAB — URINALYSIS W MICROSCOPIC + REFLEX CULTURE
Bacteria, UA: NONE SEEN [HPF]
Bilirubin Urine: NEGATIVE
CRYSTALS: NONE SEEN [HPF]
Casts: NONE SEEN [LPF]
GLUCOSE, UA: NEGATIVE
Hgb urine dipstick: NEGATIVE
Ketones, ur: NEGATIVE
LEUKOCYTES UA: NEGATIVE
Nitrite: NEGATIVE
PROTEIN: NEGATIVE
RBC / HPF: NONE SEEN RBC/HPF (ref <=2)
SPECIFIC GRAVITY, URINE: 1.008 (ref 1.001–1.035)
WBC UA: NONE SEEN WBC/HPF (ref <=5)
YEAST: NONE SEEN [HPF]
pH: 7.5 (ref 5.0–8.0)

## 2015-08-29 ENCOUNTER — Encounter: Payer: Self-pay | Admitting: Women's Health

## 2015-08-31 ENCOUNTER — Telehealth: Payer: Self-pay | Admitting: *Deleted

## 2015-08-31 DIAGNOSIS — N63 Unspecified lump in unspecified breast: Secondary | ICD-10-CM

## 2015-08-31 DIAGNOSIS — Z803 Family history of malignant neoplasm of breast: Secondary | ICD-10-CM

## 2015-08-31 NOTE — Telephone Encounter (Signed)
Appointment on 09/03/15 @ 3:00pm pt aware.

## 2015-08-31 NOTE — Telephone Encounter (Signed)
-----   Message from Huel Cote, NP sent at 08/27/2015  3:34 PM EST ----- Needs bilateral US, lt breast recent enlargement, rt breast 2 nodules, one inner breast at 3 one outer aspect at 11  afternoon best

## 2015-09-03 ENCOUNTER — Other Ambulatory Visit: Payer: BLUE CROSS/BLUE SHIELD

## 2015-09-07 ENCOUNTER — Ambulatory Visit
Admission: RE | Admit: 2015-09-07 | Discharge: 2015-09-07 | Disposition: A | Discharge status: Home / Self Care | Payer: BLUE CROSS/BLUE SHIELD | Source: Ambulatory Visit | Attending: Women's Health | Admitting: Women's Health

## 2015-09-07 DIAGNOSIS — N63 Unspecified lump in unspecified breast: Secondary | ICD-10-CM

## 2015-09-07 DIAGNOSIS — Z803 Family history of malignant neoplasm of breast: Secondary | ICD-10-CM

## 2015-11-21 ENCOUNTER — Ambulatory Visit (INDEPENDENT_AMBULATORY_CARE_PROVIDER_SITE_OTHER): Payer: BLUE CROSS/BLUE SHIELD | Admitting: Physician Assistant

## 2015-11-21 VITALS — BP 118/68 | HR 78 | Temp 98.3°F | Resp 16 | Ht 66.25 in | Wt 163.4 lb

## 2015-11-21 DIAGNOSIS — R300 Dysuria: Secondary | ICD-10-CM | POA: Diagnosis not present

## 2015-11-21 DIAGNOSIS — N3090 Cystitis, unspecified without hematuria: Secondary | ICD-10-CM | POA: Diagnosis not present

## 2015-11-21 LAB — POCT URINALYSIS DIP (MANUAL ENTRY)
BILIRUBIN UA: NEGATIVE
BILIRUBIN UA: NEGATIVE
GLUCOSE UA: NEGATIVE
Leukocytes, UA: NEGATIVE
Nitrite, UA: NEGATIVE
Protein Ur, POC: NEGATIVE
Urobilinogen, UA: 0.2
pH, UA: 7

## 2015-11-21 LAB — POC MICROSCOPIC URINALYSIS (UMFC): MUCUS RE: ABSENT

## 2015-11-21 MED ORDER — NITROFURANTOIN MONOHYD MACRO 100 MG PO CAPS: 100.0000 mg | ORAL_CAPSULE | Freq: Two times a day (BID) | ORAL | Status: AC

## 2015-11-21 NOTE — Progress Notes (Signed)
[facility] [address], [place] [address] [phone]  Date:  11/21/2015   Name:  Ashley Bond   DOB:  21-Jul-1991   MRN:  [id number]  PCP:  Hoyt Koch, MD    Chief Complaint: Dysuria   History of Present Illness:  This is a 25 y.o. female with PMH allergic rhinitis who is presenting with dysuria x 2 days. + suprapubic pressure. +urinary frequency. She states for the past year she has been getting UTIs every 2-3 months. She is not sure why this is happening now. She wipes front to back. Urinates before and after sex. Does not hold urine.  Vaginal discharge: no Hematuria: no Abdominal pain: yes, lower abdominal pressure Fever/chills: no Nausea/vominting: no Back pain: no LMP: 11/14/15 Aggravating/Alleviating factors: Been hydrating and taking cranberry tabs.   Review of Systems:  Review of Systems See HPI  Patient Active Problem List   Diagnosis Date Noted  . Rhinitis, allergic 11/30/2014  . Psoriasis 11/30/2014  . Family history of hypothyroidism 07/31/2014  . Abdominal pain, diffuse 09/03/2013  . Allergic rhinitis 03/12/2013  . Physical exam, annual 06/19/2012    Prior to Admission medications   Medication Sig Start Date End Date Taking? Authorizing Provider  etonogestrel-ethinyl estradiol (NUVARING) 0.12-0.015 MG/24HR vaginal ring Place 1 each vaginally every 28 (twenty-eight) days. Insert vaginally and leave in place for 3 consecutive weeks, then remove for 1 week.   Yes Historical Provider, MD  albuterol (PROVENTIL HFA;VENTOLIN HFA) 108 (90 BASE) MCG/ACT inhaler Inhale 1-2 puffs into the lungs every 6 (six) hours as needed for wheezing or shortness of breath. Patient not taking: Reported on 11/21/2015 07/26/15   Dorian Heckle English, PA  ibuprofen (ADVIL,MOTRIN) 200 MG tablet Take 200 mg by mouth every 6 (six) hours as needed. Reported on 11/21/2015    Historical Provider, MD    Allergies  Allergen Reactions  . Shellfish Allergy    . Pseudoephedrine Hcl Er   . Soy Allergy     Past Surgical History  Procedure Laterality Date  . Tonsillectomy    . Tonsillectomy and adenoidectomy  1997  . Fracture surgery      Social History  Substance Use Topics  . Smoking status: Former Smoker    Types: Cigarettes    Quit date: 10/31/2012  . Smokeless tobacco: Never Used     Comment: 1/2 PACK X 1 WEEK   . Alcohol Use: 0.0 oz/week    0 Standard drinks or equivalent per week     Comment: A COUPLE DRINKS NIGHTLY     Family History  Problem Relation Age of Onset  . Hyperlipidemia Mother   . Polycystic ovary syndrome Mother   . Breast cancer Maternal Aunt   . Breast cancer Maternal Grandmother   . Cancer Maternal Grandmother     breast  . Breast cancer Paternal Grandmother   . Arthritis Paternal Grandfather   . Cancer Paternal Grandfather     breast  . Heart disease Father   . Sleep apnea Father   . Sleep apnea Brother   . Liver disease Father     Medication list has been reviewed and updated.  Physical Examination:  Physical Exam  Constitutional: She is oriented to person, place, and time. She appears well-developed and well-nourished. No distress.  HENT:  Head: Normocephalic and atraumatic.  Right Ear: Hearing normal.  Left Ear: Hearing normal.  Nose: Nose normal.  Eyes: Conjunctivae and lids are normal. Right eye exhibits no discharge.  Left eye exhibits no discharge. No scleral icterus.  Cardiovascular: Normal rate, regular rhythm, normal heart sounds and normal pulses.   No murmur heard. Pulmonary/Chest: Effort normal and breath sounds normal. No respiratory distress.  Abdominal: Soft. Normal appearance. There is tenderness in the suprapubic area.  Musculoskeletal: Normal range of motion.  Neurological: She is alert and oriented to person, place, and time.  Skin: Skin is warm, dry and intact. No lesion and no rash noted.  Psychiatric: She has a normal mood and affect. Her speech is normal and behavior  is normal. Thought content normal.    BP 118/68 mmHg  Pulse 78  Temp(Src) 98.3 F (36.8 C) (Oral)  Resp 16  Ht 5' 6.25" (1.683 m)  Wt 163 lb 6.4 oz (74.118 kg)  BMI 26.17 kg/m2  SpO2 98%  LMP 11/14/2015  Results for orders placed or performed in visit on 11/21/15  POCT Microscopic Urinalysis ([facility])  Result Value Ref Range   WBC,UR,HPF,POC Few (A) None WBC/hpf   RBC,UR,HPF,POC Few (A) None RBC/hpf   Bacteria Few (A) None, Too numerous to count   Mucus Absent Absent   Epithelial Cells, UR Per Microscopy Few (A) None, Too numerous to count cells/hpf  POCT urinalysis dipstick  Result Value Ref Range   Color, UA yellow yellow   Clarity, UA clear clear   Glucose, UA negative negative   Bilirubin, UA negative negative   Ketones, POC UA negative negative   Spec Grav, UA <=1.005    Blood, UA small (A) negative   pH, UA 7.0    Protein Ur, POC negative negative   Urobilinogen, UA 0.2    Nitrite, UA Negative Negative   Leukocytes, UA Negative Negative    Assessment and Plan:  1. Dysuria 2. Cystitis  UA micro with few wbc, few rbc, few bacteria. Urine culture pending. Will go ahead and treat with macrobid even though UA not overwhelming d/t severity of sx. She will start taking cranberry tabs daily. Discussed importance of hydration. If another episode occurs in close succession, refer to urology. - POCT Microscopic Urinalysis ([facility]) - POCT urinalysis dipstick - Urine culture - nitrofurantoin, macrocrystal-monohydrate, (MACROBID) 100 MG capsule; Take 1 capsule (100 mg total) by mouth 2 (two) times daily.  Dispense: 14 capsule; Refill: 0   Benjaman Pott. Drenda Freeze, MHS [facility]  11/21/2015

## 2015-11-21 NOTE — Patient Instructions (Signed)
Drink plenty of water (at least 64 oz per day). Take antibiotic as prescribed until finished. Cranberry juice/pills can help. You can buy AZO over the counter and take as needed for pain. Follow directions on the packaging. I will call you with results from your urine culture. If symptoms are not improving in 1 week, return to clinic.  Starting taking cranberry pills daily. If you get another UTI in close succession, will send you to urology.

## 2015-11-22 LAB — URINE CULTURE
COLONY COUNT: NO GROWTH
Organism ID, Bacteria: NO GROWTH

## 2015-11-26 ENCOUNTER — Other Ambulatory Visit: Payer: Self-pay | Admitting: Physician Assistant

## 2015-11-26 DIAGNOSIS — R300 Dysuria: Secondary | ICD-10-CM

## 2016-06-29 ENCOUNTER — Other Ambulatory Visit: Payer: Self-pay | Admitting: Family Medicine

## 2016-06-29 DIAGNOSIS — N6310 Unspecified lump in the right breast, unspecified quadrant: Secondary | ICD-10-CM

## 2016-07-06 ENCOUNTER — Encounter: Payer: Self-pay | Admitting: Nurse Practitioner

## 2016-07-06 ENCOUNTER — Other Ambulatory Visit (INDEPENDENT_AMBULATORY_CARE_PROVIDER_SITE_OTHER): Payer: Managed Care, Other (non HMO)

## 2016-07-06 ENCOUNTER — Ambulatory Visit (INDEPENDENT_AMBULATORY_CARE_PROVIDER_SITE_OTHER): Payer: Managed Care, Other (non HMO) | Admitting: Nurse Practitioner

## 2016-07-06 VITALS — BP 118/64 | HR 80 | Temp 97.1°F | Ht 66.0 in | Wt 151.0 lb

## 2016-07-06 DIAGNOSIS — N3090 Cystitis, unspecified without hematuria: Secondary | ICD-10-CM

## 2016-07-06 DIAGNOSIS — Z23 Encounter for immunization: Secondary | ICD-10-CM | POA: Diagnosis not present

## 2016-07-06 DIAGNOSIS — R300 Dysuria: Secondary | ICD-10-CM

## 2016-07-06 LAB — POCT URINALYSIS DIPSTICK
BILIRUBIN UA: NEGATIVE
Glucose, UA: NEGATIVE
KETONES UA: NEGATIVE
Nitrite, UA: NEGATIVE
PH UA: 6
SPEC GRAV UA: 1.02
Urobilinogen, UA: 0.2

## 2016-07-06 LAB — URINALYSIS, ROUTINE W REFLEX MICROSCOPIC
BILIRUBIN URINE: NEGATIVE
Ketones, ur: NEGATIVE
NITRITE: NEGATIVE
PH: 5.5 (ref 5.0–8.0)
Specific Gravity, Urine: 1.01 (ref 1.000–1.030)
TOTAL PROTEIN, URINE-UPE24: NEGATIVE
Urine Glucose: NEGATIVE
Urobilinogen, UA: 0.2 (ref 0.0–1.0)

## 2016-07-06 MED ORDER — PHENAZOPYRIDINE HCL 100 MG PO TABS: 100.0000 mg | ORAL_TABLET | Freq: Three times a day (TID) | ORAL | 0 refills | Status: DC | PRN

## 2016-07-06 MED ORDER — CIPROFLOXACIN HCL 250 MG PO TABS: 250.0000 mg | ORAL_TABLET | Freq: Two times a day (BID) | ORAL | 0 refills | Status: DC

## 2016-07-06 NOTE — Patient Instructions (Signed)
Consider referral to urologist if symptoms reoccur and/or urine culture if negative.

## 2016-07-06 NOTE — Progress Notes (Signed)
Reviewed with patient in office. See office note

## 2016-07-06 NOTE — Progress Notes (Addendum)
Subjective:  Patient ID: Ashley Bond, female    DOB: [birth year]  Age: 25 y.o. MRN: [id number]  CC: Urinary Tract Infection (Pt stated having burning/painful when using bathroom)   Urinary Tract Infection   This is a new problem. The current episode started in the past 7 days. The problem has been rapidly worsening. The quality of the pain is described as burning. The pain is at a severity of 7/10. The pain is severe. There has been no fever. She is sexually active. There is no history of pyelonephritis. Associated symptoms include frequency and urgency. Pertinent negatives include no chills, discharge, flank pain, hematuria, hesitancy, nausea or possible pregnancy. She has tried increased fluids for the symptoms. The treatment provided no relief. Her past medical history is significant for recurrent UTIs. There is no history of urinary stasis or a urological procedure.  symptoms seem to occur after sexual intercourse, condoms used. Last treated for UTI 11/2015 but urine culture was negative for bacteria growth. IUD inserted 3weeks ago.  Outpatient Medications Prior to Visit  Medication Sig Dispense Refill  . albuterol (PROVENTIL HFA;VENTOLIN HFA) 108 (90 BASE) MCG/ACT inhaler Inhale 1-2 puffs into the lungs every 6 (six) hours as needed for wheezing or shortness of breath. 6.7 g 2  . ibuprofen (ADVIL,MOTRIN) 200 MG tablet Take 200 mg by mouth every 6 (six) hours as needed. Reported on 11/21/2015    . etonogestrel-ethinyl estradiol (NUVARING) 0.12-0.015 MG/24HR vaginal ring Place 1 each vaginally every 28 (twenty-eight) days. Insert vaginally and leave in place for 3 consecutive weeks, then remove for 1 week.     No facility-administered medications prior to visit.     ROS See HPI  Objective:  BP 118/64 (BP Location: Right Arm, Patient Position: Sitting, Cuff Size: Normal)   Pulse 80   Temp 97.1 F (36.2 C)   Ht 5\' 6"  (1.676 m)   Wt 151 lb 0.6 oz (68.5 kg)   SpO2 98%   BMI 24.38  kg/m   BP Readings from Last 3 Encounters:  07/06/16 118/64  11/21/15 118/68  08/27/15 130/80    Wt Readings from Last 3 Encounters:  07/06/16 151 lb 0.6 oz (68.5 kg)  11/21/15 163 lb 6.4 oz (74.1 kg)  08/27/15 166 lb (75.3 kg)    Physical Exam  Constitutional: She is oriented to person, place, and time. No distress.  Cardiovascular: Normal rate.   Pulmonary/Chest: Effort normal.  Abdominal: Soft. She exhibits no distension. There is no tenderness.  Neurological: She is alert and oriented to person, place, and time.  Skin: Skin is warm and dry.  Vitals reviewed.   Lab Results  Component Value Date   WBC 7.5 09/03/2013   HGB 12.2 09/03/2013   HCT 35.8 (L) 09/03/2013   PLT 380.0 09/03/2013   GLUCOSE 86 10/15/2014   CHOL 296 (H) 10/15/2014   TRIG 58.0 10/15/2014   HDL 141.30 10/15/2014   LDLDIRECT 97.1 06/19/2012   LDLCALC 143 (H) 10/15/2014   ALT 21 10/15/2014   AST 23 10/15/2014   NA 138 10/15/2014   K 4.1 10/15/2014   CL 105 10/15/2014   CREATININE 0.7 10/15/2014   BUN 8 10/15/2014   CO2 25 10/15/2014   TSH 0.75 07/31/2014    Assessment & Plan:   Ashley Bond was seen today for urinary tract infection.  Diagnoses and all orders for this visit:  Cystitis -     Urinalysis; Future -     POCT Urinalysis Dipstick -  phenazopyridine (PYRIDIUM) 100 MG tablet; Take 1 tablet (100 mg total) by mouth 3 (three) times daily as needed for pain (with food). -     ciprofloxacin (CIPRO) 250 MG tablet; Take 1 tablet (250 mg total) by mouth 2 (two) times daily.  Need for prophylactic vaccination and inoculation against influenza -     Flu Vaccine QUAD 36+ mos IM   I have discontinued Ms. Wenzell etonogestrel-ethinyl estradiol. I am also having her start on phenazopyridine and ciprofloxacin. Additionally, I am having her maintain her ibuprofen, albuterol, and levonorgestrel.  Meds ordered this encounter  Medications  . levonorgestrel (LILETTA, 52 MG,) 18.6 MCG/DAY IUD  IUD    Sig: 1 each by Intrauterine route once.  . phenazopyridine (PYRIDIUM) 100 MG tablet    Sig: Take 1 tablet (100 mg total) by mouth 3 (three) times daily as needed for pain (with food).    Dispense:  10 tablet    Refill:  0    Order Specific Question:   Supervising Provider    Answer:   Cassandria Anger [[id number]]  . ciprofloxacin (CIPRO) 250 MG tablet    Sig: Take 1 tablet (250 mg total) by mouth 2 (two) times daily.    Dispense:  6 tablet    Refill:  0    Order Specific Question:   Supervising Provider    Answer:   Cassandria Anger [[id number]]    Follow-up: Return if symptoms worsen or fail to improve.  Wilfred Lacy, NP

## 2016-07-11 ENCOUNTER — Encounter: Payer: Self-pay | Admitting: Internal Medicine

## 2016-07-11 DIAGNOSIS — R300 Dysuria: Secondary | ICD-10-CM

## 2016-07-14 ENCOUNTER — Ambulatory Visit
Admission: RE | Admit: 2016-07-14 | Discharge: 2016-07-14 | Disposition: A | Discharge status: Home / Self Care | Payer: Managed Care, Other (non HMO) | Source: Ambulatory Visit | Attending: Family Medicine | Admitting: Family Medicine

## 2016-07-14 DIAGNOSIS — N6310 Unspecified lump in the right breast, unspecified quadrant: Secondary | ICD-10-CM

## 2016-11-09 ENCOUNTER — Ambulatory Visit (INDEPENDENT_AMBULATORY_CARE_PROVIDER_SITE_OTHER): Payer: Managed Care, Other (non HMO) | Admitting: Internal Medicine

## 2016-11-09 ENCOUNTER — Ambulatory Visit (INDEPENDENT_AMBULATORY_CARE_PROVIDER_SITE_OTHER)
Admission: RE | Admit: 2016-11-09 | Discharge: 2016-11-09 | Disposition: A | Discharge status: Home / Self Care | Payer: Managed Care, Other (non HMO) | Source: Ambulatory Visit | Attending: Internal Medicine | Admitting: Internal Medicine

## 2016-11-09 ENCOUNTER — Encounter: Payer: Self-pay | Admitting: Internal Medicine

## 2016-11-09 VITALS — BP 128/70 | HR 72 | Temp 98.3°F | Resp 14 | Ht 66.0 in | Wt 155.0 lb

## 2016-11-09 DIAGNOSIS — L409 Psoriasis, unspecified: Secondary | ICD-10-CM | POA: Diagnosis not present

## 2016-11-09 NOTE — Progress Notes (Signed)
   Subjective:    Patient ID: Ashley Bond, female    DOB: 05-Dec-1990, 26 y.o.   MRN: [id number]  HPI The patient is a 26 YO female coming in for joint pains. She does have psoriatic arthritis and has been using NSAIDs and stretching and yoga. She has seen the rheumatologist and wants to go back. She is feeling slightly anxious about the future wondering if she will have early joint problems. She does not have stiffness in the morning. Some stiffness. She denies any red or swollen joints.   Review of Systems  Constitutional: Negative for activity change, appetite change, fatigue, fever and unexpected weight change.  Respiratory: Negative.   Cardiovascular: Negative.   Gastrointestinal: Negative.   Musculoskeletal: Positive for arthralgias and back pain. Negative for gait problem, joint swelling, myalgias, neck pain and neck stiffness.  Skin: Negative.   Neurological: Negative.   Psychiatric/Behavioral: Positive for dysphoric mood. The patient is nervous/anxious.       Objective:   Physical Exam  Constitutional: She is oriented to person, place, and time. She appears well-developed and well-nourished.  HENT:  Head: Normocephalic and atraumatic.  Eyes: EOM are normal.  Neck: Normal range of motion.  Cardiovascular: Normal rate and regular rhythm.   Pulmonary/Chest: Effort normal and breath sounds normal.  Abdominal: Soft.  Musculoskeletal: She exhibits no edema.  Some tenderness in the joints, no redness or inflammation.  Neurological: She is alert and oriented to person, place, and time.  Skin: Skin is warm and dry.   Vitals:   11/09/16 1423  BP: 128/70  Pulse: 72  Resp: 14  Temp: 98.3 F (36.8 C)  TempSrc: Oral  SpO2: 100%  Weight: 155 lb (70.3 kg)  Height: 5\' 6"  (1.676 m)      Assessment & Plan:

## 2016-11-09 NOTE — Progress Notes (Signed)
Pre visit review using our clinic review tool, if applicable. No additional management support is needed unless otherwise documented below in the visit note. 

## 2016-11-09 NOTE — Assessment & Plan Note (Signed)
Referral back to rheumatology for check in. She is doing well with stretching and NSAIDs for pain. X-ray of the hands today so that it can be compared in the future for deterioration.

## 2016-11-09 NOTE — Patient Instructions (Signed)
We will get you in with the rheumatologist and also the therapist.  We are doing the x-ray of the hands today.

## 2016-12-27 ENCOUNTER — Ambulatory Visit (INDEPENDENT_AMBULATORY_CARE_PROVIDER_SITE_OTHER): Payer: Managed Care, Other (non HMO) | Admitting: Licensed Clinical Social Worker

## 2016-12-27 DIAGNOSIS — F419 Anxiety disorder, unspecified: Secondary | ICD-10-CM

## 2016-12-28 ENCOUNTER — Ambulatory Visit (HOSPITAL_COMMUNITY)
Admission: EM | Admit: 2016-12-28 | Discharge: 2016-12-28 | Disposition: A | Discharge status: Home / Self Care | Payer: Managed Care, Other (non HMO) | Attending: Family Medicine | Admitting: Family Medicine

## 2016-12-28 ENCOUNTER — Encounter (HOSPITAL_COMMUNITY): Payer: Self-pay | Admitting: Emergency Medicine

## 2016-12-28 DIAGNOSIS — M5489 Other dorsalgia: Secondary | ICD-10-CM

## 2016-12-28 DIAGNOSIS — S161XXA Strain of muscle, fascia and tendon at neck level, initial encounter: Secondary | ICD-10-CM

## 2016-12-28 DIAGNOSIS — M545 Low back pain: Secondary | ICD-10-CM | POA: Diagnosis not present

## 2016-12-28 MED ORDER — NAPROXEN 500 MG PO TABS: 500.0000 mg | ORAL_TABLET | Freq: Two times a day (BID) | ORAL | 0 refills | Status: DC

## 2016-12-28 MED ORDER — METHOCARBAMOL 500 MG PO TABS: 500.0000 mg | ORAL_TABLET | Freq: Two times a day (BID) | ORAL | 0 refills | Status: DC
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
## 2017-01-22 ENCOUNTER — Ambulatory Visit (INDEPENDENT_AMBULATORY_CARE_PROVIDER_SITE_OTHER): Payer: Managed Care, Other (non HMO) | Admitting: Licensed Clinical Social Worker

## 2017-01-22 DIAGNOSIS — F419 Anxiety disorder, unspecified: Secondary | ICD-10-CM

## 2017-01-23 ENCOUNTER — Encounter: Payer: Self-pay | Admitting: Internal Medicine

## 2017-01-23 MED ORDER — TRAZODONE HCL 50 MG PO TABS: 25.0000 mg | ORAL_TABLET | Freq: Every evening | ORAL | 3 refills | Status: DC | PRN

## 2017-02-05 ENCOUNTER — Encounter: Payer: Self-pay | Admitting: Internal Medicine

## 2017-02-05 MED ORDER — FLUTICASONE PROPIONATE 50 MCG/ACT NA SUSP: 2.0000 | Freq: Every day | NASAL | 11 refills | Status: AC

## 2017-02-05 MED ORDER — EPINEPHRINE 0.3 MG/0.3ML IJ SOAJ: 0.3000 mg | Freq: Once | INTRAMUSCULAR | 0 refills | Status: AC

## 2017-02-15 ENCOUNTER — Ambulatory Visit: Payer: Managed Care, Other (non HMO) | Admitting: Licensed Clinical Social Worker

## 2017-02-15 ENCOUNTER — Ambulatory Visit (INDEPENDENT_AMBULATORY_CARE_PROVIDER_SITE_OTHER): Payer: Managed Care, Other (non HMO) | Admitting: Licensed Clinical Social Worker

## 2017-02-15 DIAGNOSIS — F419 Anxiety disorder, unspecified: Secondary | ICD-10-CM | POA: Diagnosis not present

## 2017-02-28 ENCOUNTER — Encounter: Payer: Self-pay | Admitting: Gynecology

## 2017-03-05 ENCOUNTER — Other Ambulatory Visit (INDEPENDENT_AMBULATORY_CARE_PROVIDER_SITE_OTHER): Payer: Managed Care, Other (non HMO)

## 2017-03-05 ENCOUNTER — Ambulatory Visit (INDEPENDENT_AMBULATORY_CARE_PROVIDER_SITE_OTHER): Payer: Managed Care, Other (non HMO) | Admitting: Internal Medicine

## 2017-03-05 ENCOUNTER — Encounter: Payer: Self-pay | Admitting: Internal Medicine

## 2017-03-05 VITALS — BP 120/60 | HR 77 | Temp 98.2°F | Resp 12 | Ht 66.0 in | Wt 157.0 lb

## 2017-03-05 DIAGNOSIS — Z0000 Encounter for general adult medical examination without abnormal findings: Secondary | ICD-10-CM

## 2017-03-05 DIAGNOSIS — Z803 Family history of malignant neoplasm of breast: Secondary | ICD-10-CM

## 2017-03-05 LAB — COMPREHENSIVE METABOLIC PANEL
ALT: 28 U/L (ref 0–35)
AST: 43 U/L — AB (ref 0–37)
Albumin: 4.8 g/dL (ref 3.5–5.2)
Alkaline Phosphatase: 49 U/L (ref 39–117)
BUN: 10 mg/dL (ref 6–23)
CHLORIDE: 105 meq/L (ref 96–112)
CO2: 28 meq/L (ref 19–32)
Calcium: 9.7 mg/dL (ref 8.4–10.5)
Creatinine, Ser: 0.73 mg/dL (ref 0.40–1.20)
GFR: 100.64 mL/min (ref >60.00)
GLUCOSE: 110 mg/dL — AB (ref 70–99)
Potassium: 3.9 mEq/L (ref 3.5–5.1)
SODIUM: 140 meq/L (ref 135–145)
Total Bilirubin: 0.4 mg/dL (ref 0.2–1.2)
Total Protein: 7.1 g/dL (ref 6.0–8.3)

## 2017-03-05 LAB — LIPID PANEL
CHOL/HDL RATIO: 2
Cholesterol: 174 mg/dL (ref 0–200)
HDL: 102.7 mg/dL (ref >39.00)
LDL CALC: 66 mg/dL (ref 0–99)
NONHDL: 71.55
Triglycerides: 30 mg/dL (ref 0.0–149.0)
VLDL: 6 mg/dL (ref 0.0–40.0)

## 2017-03-05 LAB — CBC
HEMATOCRIT: 37.7 % (ref 36.0–46.0)
Hemoglobin: 12.8 g/dL (ref 12.0–15.0)
MCHC: 33.8 g/dL (ref 30.0–36.0)
MCV: 90.4 fl (ref 78.0–100.0)
PLATELETS: 272 10*3/uL (ref 150.0–400.0)
RBC: 4.18 Mil/uL (ref 3.87–5.11)
RDW: 13.2 % (ref 11.5–15.5)
WBC: 8.2 10*3/uL (ref 4.0–10.5)

## 2017-03-05 LAB — VITAMIN D 25 HYDROXY (VIT D DEFICIENCY, FRACTURES): VITD: 60.06 ng/mL (ref 30.00–100.00)

## 2017-03-05 LAB — TSH: TSH: 0.69 u[IU]/mL (ref 0.35–4.50)

## 2017-03-05 NOTE — Progress Notes (Signed)
   Subjective:    Patient ID: Ashley Bond, female    DOB: Feb 14, 1991, 26 y.o.   MRN: [id number]  HPI The patient is a 26 YO female coming in for changes in her breasts. She is very sensitive to changes as there are several female family members with breast cancers (earliest diagnosed at 67). She is not having cycles due to IUD at this time. She does have fibrous breasts and has had ultrasounds in the past of the breasts and follow up about 8 months ago of the right breast. She is asking about mammogram to see if this would be an option. Does not currently have an ob/gyn. No rash or nipple discharge.   Review of Systems  Constitutional: Negative for activity change, appetite change, fatigue, fever and unexpected weight change.  Respiratory: Negative.   Cardiovascular: Negative.   Gastrointestinal: Negative.   Genitourinary:       Breast changes and soreness bilateral  Musculoskeletal: Negative.   Neurological: Negative.       Objective:   Physical Exam  Constitutional: She is oriented to person, place, and time. She appears well-developed and well-nourished.  HENT:  Head: Normocephalic and atraumatic.  Eyes: EOM are normal.  Neck: Normal range of motion.  Cardiovascular: Normal rate and regular rhythm.   Pulmonary/Chest: Effort normal. No respiratory distress. She has no wheezes. She exhibits no tenderness.  Bilateral breast exam without axillary LN present, some fibrous breast tissue without worrisome lesion, no rash or skin changes, no breast distortion or nipple discharge. Left slightly larger than right which is unchanged.   Abdominal: Soft.  Neurological: She is alert and oriented to person, place, and time.  Skin: Skin is warm and dry.   Vitals:   03/05/17 0926  BP: 120/60  Pulse: 77  Resp: 12  Temp: 98.2 F (36.8 C)  TempSrc: Oral  SpO2: 99%  Weight: 157 lb (71.2 kg)  Height: 5\' 6"  (1.676 m)      Assessment & Plan:

## 2017-03-05 NOTE — Patient Instructions (Signed)
We will get the ultrasound of the breasts.

## 2017-03-05 NOTE — Assessment & Plan Note (Signed)
Fibrous changes of both breasts on exam and family history of early breast cancer (diagnosed 50). Will get bilateral US breasts to check for changes from prior. She does self monitoring and feels that there have been changes in the last 6 months.

## 2017-03-13 ENCOUNTER — Other Ambulatory Visit: Payer: Self-pay | Admitting: Internal Medicine

## 2017-03-13 DIAGNOSIS — N644 Mastodynia: Secondary | ICD-10-CM

## 2017-03-13 DIAGNOSIS — Z803 Family history of malignant neoplasm of breast: Secondary | ICD-10-CM

## 2017-03-23 ENCOUNTER — Ambulatory Visit
Admission: RE | Admit: 2017-03-23 | Discharge: 2017-03-23 | Disposition: A | Discharge status: Home / Self Care | Payer: Managed Care, Other (non HMO) | Source: Ambulatory Visit | Attending: Internal Medicine | Admitting: Internal Medicine

## 2017-03-23 ENCOUNTER — Other Ambulatory Visit: Payer: Self-pay | Admitting: Internal Medicine

## 2017-03-23 DIAGNOSIS — N63 Unspecified lump in unspecified breast: Secondary | ICD-10-CM

## 2017-03-23 DIAGNOSIS — Z803 Family history of malignant neoplasm of breast: Secondary | ICD-10-CM

## 2017-03-23 DIAGNOSIS — N644 Mastodynia: Secondary | ICD-10-CM

## 2017-03-29 ENCOUNTER — Encounter: Payer: Self-pay | Admitting: Internal Medicine

## 2017-03-29 ENCOUNTER — Ambulatory Visit: Payer: Managed Care, Other (non HMO) | Admitting: Licensed Clinical Social Worker

## 2017-03-29 ENCOUNTER — Ambulatory Visit (INDEPENDENT_AMBULATORY_CARE_PROVIDER_SITE_OTHER): Payer: Managed Care, Other (non HMO) | Admitting: Internal Medicine

## 2017-03-29 VITALS — BP 110/80 | HR 84 | Ht 66.0 in | Wt 156.0 lb

## 2017-03-29 DIAGNOSIS — H6091 Unspecified otitis externa, right ear: Secondary | ICD-10-CM | POA: Insufficient documentation

## 2017-03-29 DIAGNOSIS — J019 Acute sinusitis, unspecified: Secondary | ICD-10-CM | POA: Diagnosis not present

## 2017-03-29 DIAGNOSIS — R062 Wheezing: Secondary | ICD-10-CM | POA: Diagnosis not present

## 2017-03-29 MED ORDER — METHYLPREDNISOLONE 4 MG PO TBPK: ORAL_TABLET | ORAL | 0 refills | Status: DC

## 2017-03-29 MED ORDER — LEVOFLOXACIN 500 MG PO TABS: 500.0000 mg | ORAL_TABLET | Freq: Every day | ORAL | 0 refills | Status: AC

## 2017-03-29 MED ORDER — NEOMYCIN-POLYMYXIN-HC 1 % OT SOLN: 3.0000 [drp] | Freq: Four times a day (QID) | OTIC | 0 refills | Status: AC

## 2017-03-29 NOTE — Patient Instructions (Signed)
Please take all new medication as prescribed - the ear drops, and pill antibiotic, as well as the medrol if the wheezing is worsening  You can also take Delsym OTC for cough, and/or Mucinex (or it's generic off brand) for congestion, and tylenol as needed for pain.  Please continue all other medications as before, and refills have been done if requested.  Please have the pharmacy call with any other refills you may need.  Please keep your appointments with your specialists as you may have planned

## 2017-03-29 NOTE — Assessment & Plan Note (Signed)
Mild to mod, for antibx course,  to f/u any worsening symptoms or concerns 

## 2017-03-29 NOTE — Assessment & Plan Note (Signed)
Mild, cont inhaler, exam without wheeze this am but gave medrol pak to start if wheezing worsens

## 2017-03-29 NOTE — Progress Notes (Signed)
Subjective:    Patient ID: Ashley Bond, female    DOB: [birth year], 26 y.o.   MRN: [id number]  HPI  . Here with 5 days acute onset fever, facial pain, pressure, headache, general weakness and malaise, and greenish d/c, with severe ST and cough, but pt denies chest pain, wheezing, increased sob or doe, orthopnea, PND, increased LE swelling, palpitations, dizziness or syncope.  Also has new onset 2-3 days unusually severe right ear pain with tenderness to touch, but no d/c.  Pt denies chest pain, increased sob or doe, orthopnea, PND, increased LE swelling, palpitations, dizziness or syncope, but has had a trace of wheezing show up last night similar to prior episodes with infectious etiologies.  Inhaler helped and has not had to use this AM.  Does not want to take prednisone if possible Past Medical History:  Diagnosis Date  . Allergy   . Anxiety   . Asthma    exercise induced  . Chicken pox    Past Surgical History:  Procedure Laterality Date  . FRACTURE SURGERY    . TONSILLECTOMY    . TONSILLECTOMY AND ADENOIDECTOMY  1997    reports that she has never smoked. She has never used smokeless tobacco. She reports that she drinks alcohol. She reports that she does not use drugs. family history includes Arthritis in her paternal grandfather; Breast cancer in her maternal aunt, maternal grandmother, and paternal grandmother; Cancer in her maternal grandmother and paternal grandfather; Heart disease in her father; Hyperlipidemia in her mother; Liver disease in her father; Polycystic ovary syndrome in her mother; Sleep apnea in her brother and father. Allergies  Allergen Reactions  . Shellfish Allergy   . Pseudoephedrine Hcl Er   . Soy Allergy    Current Outpatient Prescriptions on File Prior to Visit  Medication Sig Dispense Refill  . albuterol (PROVENTIL HFA;VENTOLIN HFA) 108 (90 BASE) MCG/ACT inhaler Inhale 1-2 puffs into the lungs every 6 (six) hours as needed for wheezing or shortness of  breath. 6.7 g 2  . etanercept (ENBREL) 25 MG injection Inject 25 mg into the skin.    . fluticasone (FLONASE) 50 MCG/ACT nasal spray Place 2 sprays into both nostrils daily. 16 g 11  . ibuprofen (ADVIL,MOTRIN) 200 MG tablet Take 200 mg by mouth every 6 (six) hours as needed. Reported on 11/21/2015    . levonorgestrel (LILETTA, 52 MG,) 18.6 MCG/DAY IUD IUD 1 each by Intrauterine route once.    . naproxen (NAPROSYN) 500 MG tablet Take 1 tablet (500 mg total) by mouth 2 (two) times daily with a meal. 20 tablet 0  . traZODone (DESYREL) 50 MG tablet Take 0.5-1 tablets (25-50 mg total) by mouth at bedtime as needed for sleep. 30 tablet 3   No current facility-administered medications on file prior to visit.    Review of Systems  Constitutional: Negative for other unusual diaphoresis or sweats HENT: Negative for ear discharge or swelling Eyes: Negative for other worsening visual disturbances Respiratory: Negative for stridor or other swelling  Gastrointestinal: Negative for worsening distension or other blood Genitourinary: Negative for retention or other urinary change Musculoskeletal: Negative for other MSK pain or swelling Skin: Negative for color change or other new lesions Neurological: Negative for worsening tremors and other numbness  Psychiatric/Behavioral: Negative for worsening agitation or other fatigue All other system neg per pt    Objective:   Physical Exam BP 110/80   Pulse 84   Ht 5\' 6"  (1.676 m)   Wt 156 lb (  70.8 kg)   SpO2 100%   BMI 25.18 kg/m  VS noted, mild ill  Constitutional: Pt appears in NAD HENT: Head: NCAT.  Right Ear: External ear normal. Right canal with 1-2+ red, tender, swelling without d/c Left Ear: External ear normal. Left canal clear Bilat tm's with mod erythema.  Max sinus areas mild tender bilat.  Pharynx with mod erythema, no exudate Eyes: . Pupils are equal, round, and reactive to light. Conjunctivae and EOM are normal Nose: without d/c or  deformity Neck: Neck supple. Gross normal ROM Cardiovascular: Normal rate and regular rhythm.   Pulmonary/Chest: Effort normal and breath sounds without rales or wheezing.  Neurological: Pt is alert. At baseline orientation, motor grossly intact Skin: Skin is warm. No rashes, other new lesions, no LE edema Psychiatric: Pt behavior is normal without agitation  No other exam findings    Assessment & Plan:

## 2017-04-27 ENCOUNTER — Ambulatory Visit (INDEPENDENT_AMBULATORY_CARE_PROVIDER_SITE_OTHER): Payer: Managed Care, Other (non HMO) | Admitting: Licensed Clinical Social Worker

## 2017-04-27 DIAGNOSIS — F419 Anxiety disorder, unspecified: Secondary | ICD-10-CM

## 2017-05-08 ENCOUNTER — Encounter: Payer: Self-pay | Admitting: Nurse Practitioner

## 2017-05-08 ENCOUNTER — Ambulatory Visit (INDEPENDENT_AMBULATORY_CARE_PROVIDER_SITE_OTHER): Payer: Managed Care, Other (non HMO) | Admitting: Nurse Practitioner

## 2017-05-08 VITALS — BP 118/70 | HR 72 | Temp 98.6°F | Ht 66.0 in | Wt 152.0 lb

## 2017-05-08 DIAGNOSIS — G4700 Insomnia, unspecified: Secondary | ICD-10-CM

## 2017-05-08 DIAGNOSIS — F419 Anxiety disorder, unspecified: Secondary | ICD-10-CM

## 2017-05-08 DIAGNOSIS — R102 Pelvic and perineal pain: Secondary | ICD-10-CM

## 2017-05-08 LAB — POCT URINALYSIS DIPSTICK
Bilirubin, UA: NEGATIVE
Glucose, UA: NEGATIVE
Ketones, UA: NEGATIVE
Leukocytes, UA: NEGATIVE
Nitrite, UA: NEGATIVE
Protein, UA: NEGATIVE
Spec Grav, UA: 1.01
Urobilinogen, UA: 0.2 U/dL
pH, UA: 6

## 2017-05-08 MED ORDER — TRAZODONE HCL 50 MG PO TABS: 50.0000 mg | ORAL_TABLET | Freq: Every evening | ORAL | 3 refills | Status: DC | PRN

## 2017-05-08 NOTE — Patient Instructions (Addendum)
F/up with GYN if bleeding worsens or ABD pain worsens

## 2017-05-08 NOTE — Progress Notes (Signed)
Subjective:  Patient ID: Ashley Bond, female    DOB: 1990/12/17  Age: 26 y.o. MRN: [id number]  CC: Follow-up (had IUD in last year--no period--last week had her period  (full),cramps and fatigue/alot of stress/ trazadone consult?)   Abdominal Cramping  This is a new problem. The current episode started in the past 7 days. The onset quality is gradual. The problem occurs intermittently. The problem has been gradually improving. The pain is located in the suprapubic region. The quality of the pain is dull and cramping. The abdominal pain does not radiate. Pertinent negatives include no anorexia, belching, constipation, diarrhea, dysuria, fever, frequency, hematochezia, hematuria, nausea or vomiting. Exacerbated by: onset on menstrual cycle 4days ago. The pain is relieved by nothing. She has tried nothing for the symptoms.  menstrual bleeding: spotting, no clots. IUD present.  Insomnia: Due to stress and anxiety Improved with trazodone 50mg  but will like to increase dose. Wakes up every 4hrs. Takes over 1hr to fall asleep. counseling with psychology and use of trazodone. Does not want to use any medication for anxiety at this time. Use benzodiazepine and SSRI in past. No ETOH or substance abuse, no caffeine use.  Outpatient Medications Prior to Visit  Medication Sig Dispense Refill  . albuterol (PROVENTIL HFA;VENTOLIN HFA) 108 (90 BASE) MCG/ACT inhaler Inhale 1-2 puffs into the lungs every 6 (six) hours as needed for wheezing or shortness of breath. 6.7 g 2  . etanercept (ENBREL) 25 MG injection Inject 25 mg into the skin.    . fluticasone (FLONASE) 50 MCG/ACT nasal spray Place 2 sprays into both nostrils daily. 16 g 11  . ibuprofen (ADVIL,MOTRIN) 200 MG tablet Take 200 mg by mouth every 6 (six) hours as needed. Reported on 11/21/2015    . levonorgestrel (LILETTA, 52 MG,) 18.6 MCG/DAY IUD IUD 1 each by Intrauterine route once.    . traZODone (DESYREL) 50 MG tablet Take 0.5-1 tablets  (25-50 mg total) by mouth at bedtime as needed for sleep. 30 tablet 3  . methylPREDNISolone (MEDROL DOSEPAK) 4 MG TBPK tablet Use as directed (Patient not taking: Reported on 05/08/2017) 21 tablet 0  . naproxen (NAPROSYN) 500 MG tablet Take 1 tablet (500 mg total) by mouth 2 (two) times daily with a meal. (Patient not taking: Reported on 05/08/2017) 20 tablet 0   No facility-administered medications prior to visit.     ROS See HPI  Objective:  BP 118/70   Pulse 72   Temp 98.6 F (37 C)   Ht 5\' 6"  (1.676 m)   Wt 152 lb (68.9 kg)   SpO2 100%   BMI 24.53 kg/m   BP Readings from Last 3 Encounters:  05/08/17 118/70  03/29/17 110/80  03/05/17 120/60    Wt Readings from Last 3 Encounters:  05/08/17 152 lb (68.9 kg)  03/29/17 156 lb (70.8 kg)  03/05/17 157 lb (71.2 kg)    Physical Exam  Constitutional: She is oriented to person, place, and time.  Cardiovascular: Normal rate.   Pulmonary/Chest: Effort normal.  Abdominal: Soft. Bowel sounds are normal. She exhibits no distension. There is tenderness.  Musculoskeletal: Normal range of motion. She exhibits no edema.  Neurological: She is alert and oriented to person, place, and time.  Skin: Skin is warm and dry. No rash noted. No erythema.  Vitals reviewed.   Lab Results  Component Value Date   WBC 8.2 03/05/2017   HGB 12.8 03/05/2017   HCT 37.7 03/05/2017   PLT 272.0 03/05/2017   GLUCOSE  110 (H) 03/05/2017   CHOL 174 03/05/2017   TRIG 30.0 03/05/2017   HDL 102.70 03/05/2017   LDLDIRECT 97.1 06/19/2012   LDLCALC 66 03/05/2017   ALT 28 03/05/2017   AST 43 (H) 03/05/2017   NA 140 03/05/2017   K 3.9 03/05/2017   CL 105 03/05/2017   CREATININE 0.73 03/05/2017   BUN 10 03/05/2017   CO2 28 03/05/2017   TSH 0.69 03/05/2017    US Breast Ltd Uni Right Inc Axilla  Result Date: 03/23/2017 CLINICAL DATA:  26 year old female here for are shooting pain in the upper-outer quadrant of the right breast associated with 2-3  questionable areas of palpable concern. Of note, we have been following a probably benign mass at 10 o'clock in the right breast since November 2016. EXAM: ULTRASOUND OF THE RIGHT BREAST COMPARISON:  Previous exam(s). FINDINGS: On physical exam, there is a generalized nodularity to the upper-outer quadrant of the right breast with the known probably benign mass at 10 o'clock that is freely mobile. No new masses are palpated. Targeted ultrasound is performed, showing the stable, well-circumscribed, oval, hypoechoic mass with mild internal vascularity and positive posterior acoustic enhancement at 10 o'clock, 5 cm from the nipple measuring 1.1 x 0.9 x 1.4 cm in comparison to 1.1 x 0.9 x 1.4 cm in November 2016. No additional masses are seen in the upper-outer quadrant of the right breast. 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 No additional or new masses are identified. RECOMMENDATION: Six-month follow-up with right breast ultrasound. That will conclude her 2 year follow-up. I have discussed the findings and recommendations with the patient. Results were also provided in writing at the conclusion of the visit. If applicable, a reminder letter will be sent to the patient regarding the next appointment. BI-RADS CATEGORY  3: Probably benign. Electronically Signed   By: Trude Mcburney M.D.   On: 03/23/2017 11:55    Assessment & Plan:   Ashley Bond was seen today for follow-up.  Diagnoses and all orders for this visit:  Insomnia, unspecified type -     traZODone (DESYREL) 50 MG tablet; Take 1-2 tablets (50-100 mg total) by mouth at bedtime as needed for sleep.  Suprapubic abdominal pain -     POCT urinalysis dipstick  Anxiety -     traZODone (DESYREL) 50 MG tablet; Take 1-2 tablets (50-100 mg total) by mouth at bedtime as needed for sleep.   I have discontinued Ashley Bond naproxen and methylPREDNISolone. I have also changed her  traZODone. Additionally, I am having her maintain her ibuprofen, albuterol, levonorgestrel, fluticasone, and etanercept.  Meds ordered this encounter  Medications  . traZODone (DESYREL) 50 MG tablet    Sig: Take 1-2 tablets (50-100 mg total) by mouth at bedtime as needed for sleep.    Dispense:  60 tablet    Refill:  3    Order Specific Question:   Supervising Provider    Answer:   Cassandria Anger [[id number]]    Follow-up: Return in about 6 months (around 11/08/2017).  Wilfred Lacy, NP

## 2017-05-09 ENCOUNTER — Ambulatory Visit: Payer: Managed Care, Other (non HMO) | Admitting: Licensed Clinical Social Worker

## 2017-05-22 ENCOUNTER — Ambulatory Visit: Payer: Managed Care, Other (non HMO) | Admitting: Licensed Clinical Social Worker

## 2017-05-24 ENCOUNTER — Ambulatory Visit: Payer: Managed Care, Other (non HMO) | Admitting: Licensed Clinical Social Worker

## 2017-05-24 ENCOUNTER — Other Ambulatory Visit: Payer: Self-pay | Admitting: Internal Medicine

## 2017-05-30 ENCOUNTER — Ambulatory Visit: Payer: Managed Care, Other (non HMO) | Admitting: Licensed Clinical Social Worker

## 2017-06-05 ENCOUNTER — Ambulatory Visit (INDEPENDENT_AMBULATORY_CARE_PROVIDER_SITE_OTHER): Payer: Managed Care, Other (non HMO) | Admitting: Licensed Clinical Social Worker

## 2017-06-05 DIAGNOSIS — F419 Anxiety disorder, unspecified: Secondary | ICD-10-CM | POA: Diagnosis not present

## 2017-08-30 ENCOUNTER — Inpatient Hospital Stay: Admission: RE | Admit: 2017-08-30 | Payer: Self-pay | Source: Ambulatory Visit

## 2017-10-28 ENCOUNTER — Other Ambulatory Visit: Payer: Self-pay | Admitting: Nurse Practitioner

## 2017-10-28 DIAGNOSIS — G4700 Insomnia, unspecified: Secondary | ICD-10-CM

## 2017-10-28 DIAGNOSIS — F419 Anxiety disorder, unspecified: Secondary | ICD-10-CM

## 2017-10-31 LAB — CBC AND DIFFERENTIAL
HEMATOCRIT: 36 (ref 36–46)
Hemoglobin: 12 (ref 12.0–16.0)
Neutrophils Absolute: 3
PLATELETS: 252 (ref 150–399)
WBC: 5.6

## 2017-10-31 LAB — HEPATIC FUNCTION PANEL
ALT: 15 (ref 7–35)
AST: 16 (ref 13–35)
Alkaline Phosphatase: 44 (ref 25–125)
BILIRUBIN, TOTAL: 0.2

## 2017-10-31 LAB — HM PAP SMEAR

## 2017-10-31 LAB — BASIC METABOLIC PANEL
BUN: 12 (ref 4–21)
CREATININE: 0.7 (ref 0.5–1.1)
Glucose: 83
SODIUM: 141 (ref 137–147)

## 2017-10-31 LAB — TSH: TSH: 1.49 (ref 0.41–5.90)

## 2017-11-06 ENCOUNTER — Encounter: Payer: Self-pay | Admitting: Internal Medicine

## 2017-11-06 NOTE — Progress Notes (Signed)
Abstracted and sent to scan  

## 2017-11-08 ENCOUNTER — Other Ambulatory Visit: Payer: Self-pay

## 2017-11-08 ENCOUNTER — Ambulatory Visit: Payer: Self-pay | Admitting: Internal Medicine

## 2017-11-09 ENCOUNTER — Encounter: Payer: Self-pay | Admitting: Internal Medicine

## 2017-11-09 ENCOUNTER — Ambulatory Visit: Payer: Managed Care, Other (non HMO) | Admitting: Internal Medicine

## 2017-11-09 ENCOUNTER — Other Ambulatory Visit (INDEPENDENT_AMBULATORY_CARE_PROVIDER_SITE_OTHER): Payer: Managed Care, Other (non HMO)

## 2017-11-09 VITALS — BP 100/60 | HR 72 | Temp 98.2°F | Ht 66.0 in | Wt 149.0 lb

## 2017-11-09 DIAGNOSIS — R531 Weakness: Secondary | ICD-10-CM | POA: Insufficient documentation

## 2017-11-09 DIAGNOSIS — R5383 Other fatigue: Secondary | ICD-10-CM

## 2017-11-09 DIAGNOSIS — Z23 Encounter for immunization: Secondary | ICD-10-CM | POA: Diagnosis not present

## 2017-11-09 LAB — VITAMIN B12: Vitamin B-12: 353 pg/mL (ref 211–911)

## 2017-11-09 LAB — MAGNESIUM: Magnesium: 2 mg/dL (ref 1.5–2.5)

## 2017-11-09 LAB — FERRITIN: Ferritin: 44.6 ng/mL (ref 10.0–291.0)

## 2017-11-09 NOTE — Patient Instructions (Addendum)
We will check the labs today. 

## 2017-11-09 NOTE — Progress Notes (Signed)
   Subjective:    Patient ID: Ashley Bond, female    DOB: Aug 31, 1991, 27 y.o.   MRN: [id number]  HPI The patient is a 27 YO female coming in for fatigue. She has psoriasis and is on humira but her rheumatologist does not think it is related to this. Her fatigue has been present for some time. Overall worsening lately. She does try to be active and doing yoga lately. She is not sleeping that well and wakes feeling tired sometimes due to her employment she often gets off work 3am or later. She tries to go to sleep right after getting home. Several days of good sleep in a row does not restore her energy. She does also drink more water and this has not helped. Denies weight change. Strong family history of thyroid problems but levels checked about 1 week ago were normal from rheumatology.   Review of Systems  Constitutional: Positive for activity change and fatigue. Negative for appetite change, chills and unexpected weight change.  HENT: Negative.   Eyes: Negative.   Respiratory: Negative for cough, chest tightness and shortness of breath.   Cardiovascular: Negative for chest pain, palpitations and leg swelling.  Gastrointestinal: Negative for abdominal distention, abdominal pain, constipation, diarrhea, nausea and vomiting.  Musculoskeletal: Positive for arthralgias.       Joint stiffness  Skin: Negative.   Neurological: Negative.   Psychiatric/Behavioral: Positive for sleep disturbance.      Objective:   Physical Exam  Constitutional: She is oriented to person, place, and time. She appears well-developed and well-nourished.  HENT:  Head: Normocephalic and atraumatic.  Eyes: EOM are normal.  Neck: Normal range of motion.  Cardiovascular: Normal rate and regular rhythm.  Pulmonary/Chest: Effort normal and breath sounds normal. No respiratory distress. She has no wheezes. She has no rales.  Abdominal: Soft. Bowel sounds are normal. She exhibits no distension. There is no tenderness.  There is no rebound.  Musculoskeletal: She exhibits no edema or tenderness.  Neurological: She is alert and oriented to person, place, and time. No cranial nerve deficit. Coordination normal.  Skin: Skin is warm and dry.  Psychiatric: She has a normal mood and affect.   Vitals:   11/09/17 1528  BP: 100/60  Pulse: 72  Temp: 98.2 F (36.8 C)  TempSrc: Oral  SpO2: 100%  Weight: 149 lb (67.6 kg)  Height: 5\' 6"  (1.676 m)      Assessment & Plan:  Flu shot given at visit

## 2017-11-09 NOTE — Assessment & Plan Note (Signed)
Checking ferritin, B12, magnesium levels today as those were not checked with rheumatology. Suspect this is related some to sleep as she does have disrupted schedule with work. Her stress levels are better. She is exercising with yoga. No snoring to suspect OSA or high BMI. They are adjusting her psoriatic arthritis medications and see if this will help.

## 2017-11-13 ENCOUNTER — Encounter: Payer: Self-pay | Admitting: Internal Medicine

## 2017-11-16 ENCOUNTER — Ambulatory Visit
Admission: RE | Admit: 2017-11-16 | Discharge: 2017-11-16 | Disposition: A | Discharge status: Home / Self Care | Payer: Managed Care, Other (non HMO) | Source: Ambulatory Visit | Attending: Internal Medicine | Admitting: Internal Medicine

## 2017-11-16 DIAGNOSIS — Z803 Family history of malignant neoplasm of breast: Secondary | ICD-10-CM

## 2017-11-16 DIAGNOSIS — N63 Unspecified lump in unspecified breast: Secondary | ICD-10-CM

## 2017-11-18 ENCOUNTER — Other Ambulatory Visit: Payer: Self-pay | Admitting: Nurse Practitioner

## 2017-11-18 DIAGNOSIS — F419 Anxiety disorder, unspecified: Secondary | ICD-10-CM

## 2017-11-18 DIAGNOSIS — G4700 Insomnia, unspecified: Secondary | ICD-10-CM

## 2017-11-22 ENCOUNTER — Telehealth: Payer: Self-pay

## 2017-11-22 DIAGNOSIS — M059 Rheumatoid arthritis with rheumatoid factor, unspecified: Secondary | ICD-10-CM

## 2017-11-22 NOTE — Telephone Encounter (Signed)
Called patient and she wants a referral to [facility], or wants to know if maybe you know of a better Rheumatologist in [place]

## 2017-11-22 NOTE — Telephone Encounter (Signed)
Copied from [place]. Topic: Referral - Request >> Nov 22, 2017  1:43 PM Bennye Alm wrote: Reason for CRM: Patient would like for a referral to be placed to [facility] She also wants to know if there's a better provider in [facility] that Dr. Sharlet Salina can recommend. Patient can be reached at [phone].

## 2017-11-23 ENCOUNTER — Encounter: Payer: Self-pay | Admitting: Internal Medicine

## 2017-11-23 NOTE — Telephone Encounter (Signed)
Referral placed.

## 2017-11-23 NOTE — Telephone Encounter (Signed)
Tried calling patient to inform her the referral was place. Mailbox was full

## 2018-01-14 ENCOUNTER — Other Ambulatory Visit: Payer: Self-pay | Admitting: Internal Medicine

## 2018-01-14 DIAGNOSIS — F419 Anxiety disorder, unspecified: Secondary | ICD-10-CM

## 2018-01-14 DIAGNOSIS — G4700 Insomnia, unspecified: Secondary | ICD-10-CM

## 2018-03-20 ENCOUNTER — Other Ambulatory Visit: Payer: Self-pay | Admitting: Internal Medicine

## 2018-03-20 DIAGNOSIS — F419 Anxiety disorder, unspecified: Secondary | ICD-10-CM

## 2018-03-20 DIAGNOSIS — G4700 Insomnia, unspecified: Secondary | ICD-10-CM

## 2018-04-11 ENCOUNTER — Ambulatory Visit (INDEPENDENT_AMBULATORY_CARE_PROVIDER_SITE_OTHER): Payer: 59 | Admitting: Licensed Clinical Social Worker

## 2018-04-11 DIAGNOSIS — F320 Major depressive disorder, single episode, mild: Secondary | ICD-10-CM | POA: Diagnosis not present

## 2018-04-29 ENCOUNTER — Ambulatory Visit (INDEPENDENT_AMBULATORY_CARE_PROVIDER_SITE_OTHER): Payer: 59 | Admitting: Licensed Clinical Social Worker

## 2018-04-29 DIAGNOSIS — F320 Major depressive disorder, single episode, mild: Secondary | ICD-10-CM

## 2018-05-20 ENCOUNTER — Ambulatory Visit (INDEPENDENT_AMBULATORY_CARE_PROVIDER_SITE_OTHER): Payer: 59 | Admitting: Licensed Clinical Social Worker

## 2018-05-20 DIAGNOSIS — F320 Major depressive disorder, single episode, mild: Secondary | ICD-10-CM

## 2018-06-24 ENCOUNTER — Ambulatory Visit: Payer: 59 | Admitting: Licensed Clinical Social Worker

## 2018-07-01 ENCOUNTER — Ambulatory Visit (INDEPENDENT_AMBULATORY_CARE_PROVIDER_SITE_OTHER): Payer: 59 | Admitting: Licensed Clinical Social Worker

## 2018-07-01 DIAGNOSIS — F324 Major depressive disorder, single episode, in partial remission: Secondary | ICD-10-CM | POA: Diagnosis not present

## 2018-07-22 ENCOUNTER — Ambulatory Visit (INDEPENDENT_AMBULATORY_CARE_PROVIDER_SITE_OTHER): Payer: 59 | Admitting: Licensed Clinical Social Worker

## 2018-07-22 DIAGNOSIS — F324 Major depressive disorder, single episode, in partial remission: Secondary | ICD-10-CM

## 2018-08-12 ENCOUNTER — Ambulatory Visit (INDEPENDENT_AMBULATORY_CARE_PROVIDER_SITE_OTHER): Payer: 59 | Admitting: Licensed Clinical Social Worker

## 2018-08-12 DIAGNOSIS — F324 Major depressive disorder, single episode, in partial remission: Secondary | ICD-10-CM

## 2018-08-26 IMAGING — US ULTRASOUND RIGHT BREAST LIMITED
1 series · 4 of 4 positions shown · non-contrast
Comparison: 09/07/2015

CLINICAL DATA: Short-term followup for a probably benign right
breast mass. This was evaluated previously on 09/07/2015.

EXAM:
ULTRASOUND OF THE RIGHT BREAST

[Series 1: ultrasound right breast limited · 0.06mm/px · 4 of 4 slices shown]
[im 1/4]
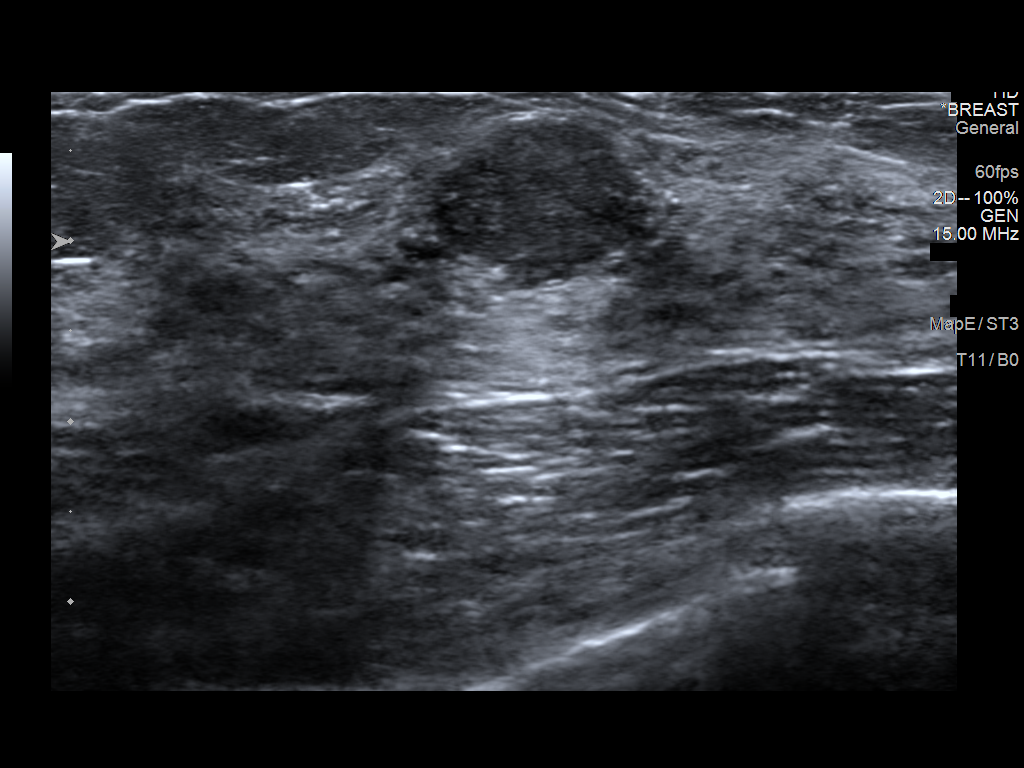
[im 2/4]
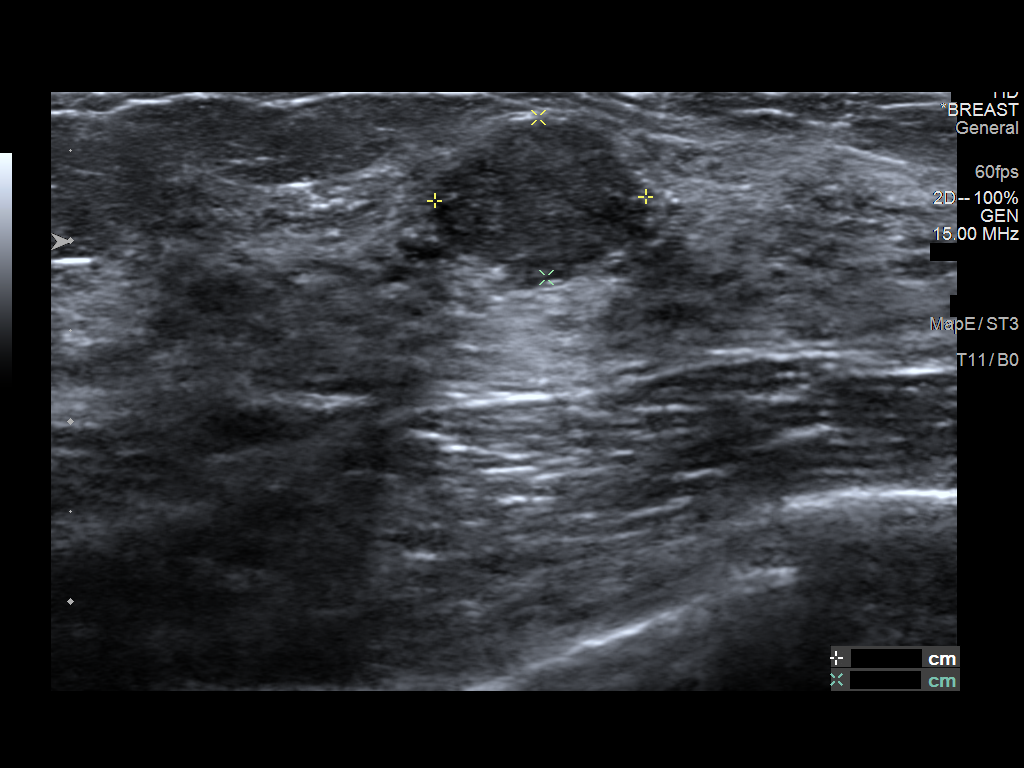
[im 3/4]
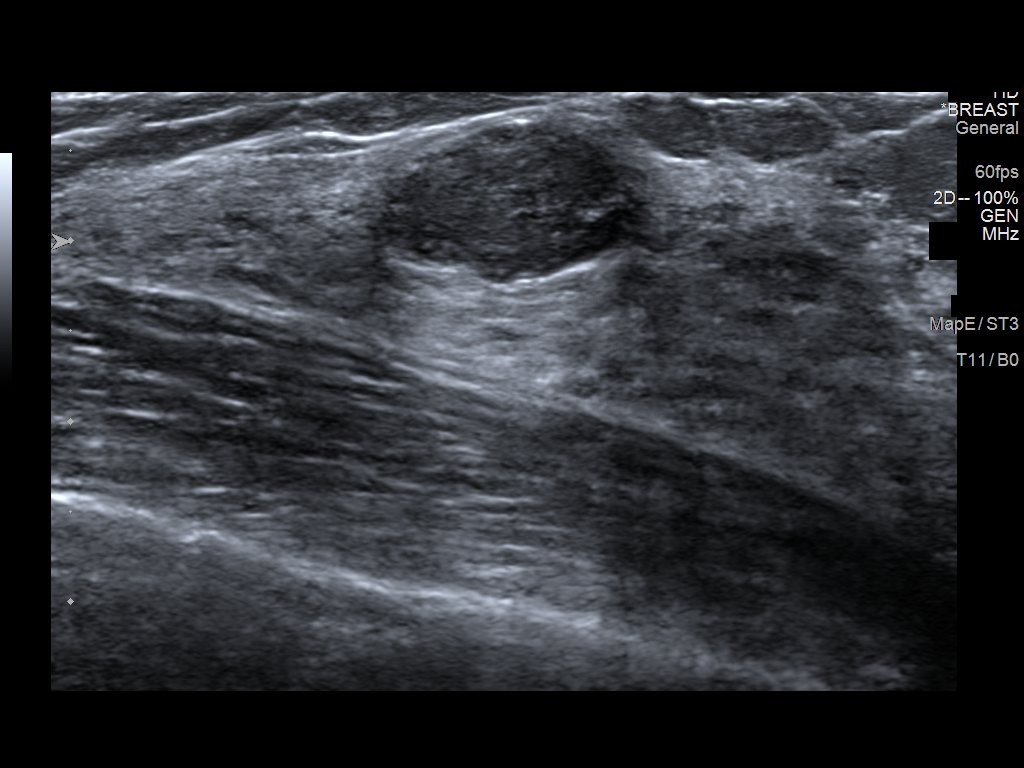
[im 4/4]
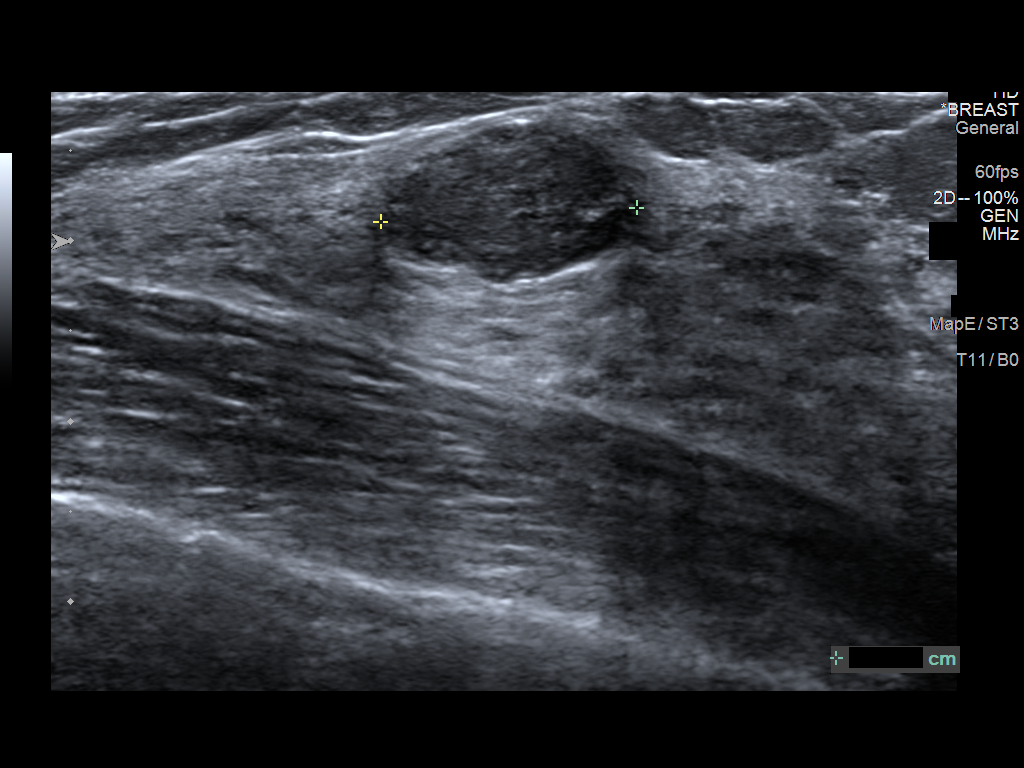

[4 of 4 positions shown; findings below may reference images not displayed]

FINDINGS: Targeted ultrasound is performed, showing an oval circumscribed
mildly hypoechoic homogeneous mass in the right breast at 10
o'clock, 5 cm the nipple, measuring 14 x 9 x 12 mm, unchanged from
prior exam.
IMPRESSION: Probably benign right breast mass most likely a fibroadenoma. This
has been stable for almost 1 year.

RECOMMENDATION:
Repeat right breast ultrasound in 1 year to document longer term
stability. If stable on the follow-up study, the mass can't be
considered benign needing no additional followup evaluation.

I have discussed the findings and recommendations with the patient.
Results were also provided in writing at the conclusion of the
visit. If applicable, a reminder letter will be sent to the patient
regarding the next appointment.

BI-RADS CATEGORY  3: Probably benign finding(s) - short interval
follow-up suggested.

## 2018-09-09 ENCOUNTER — Ambulatory Visit: Payer: 59 | Admitting: Licensed Clinical Social Worker

## 2019-10-22 DIAGNOSIS — R002 Palpitations: Secondary | ICD-10-CM | POA: Diagnosis not present

## 2019-10-27 DIAGNOSIS — I493 Ventricular premature depolarization: Secondary | ICD-10-CM | POA: Diagnosis not present

## 2019-10-28 DIAGNOSIS — F4310 Post-traumatic stress disorder, unspecified: Secondary | ICD-10-CM | POA: Diagnosis not present

## 2019-11-06 DIAGNOSIS — R5382 Chronic fatigue, unspecified: Secondary | ICD-10-CM | POA: Diagnosis not present

## 2019-11-06 DIAGNOSIS — L4050 Arthropathic psoriasis, unspecified: Secondary | ICD-10-CM | POA: Diagnosis not present

## 2019-11-06 DIAGNOSIS — Z79899 Other long term (current) drug therapy: Secondary | ICD-10-CM | POA: Diagnosis not present

## 2019-11-11 DIAGNOSIS — J309 Allergic rhinitis, unspecified: Secondary | ICD-10-CM | POA: Diagnosis not present

## 2019-11-11 DIAGNOSIS — R590 Localized enlarged lymph nodes: Secondary | ICD-10-CM | POA: Diagnosis not present

## 2019-11-11 DIAGNOSIS — F4310 Post-traumatic stress disorder, unspecified: Secondary | ICD-10-CM | POA: Diagnosis not present

## 2019-11-17 DIAGNOSIS — R591 Generalized enlarged lymph nodes: Secondary | ICD-10-CM | POA: Diagnosis not present

## 2019-11-17 DIAGNOSIS — R590 Localized enlarged lymph nodes: Secondary | ICD-10-CM | POA: Diagnosis not present

## 2019-11-25 DIAGNOSIS — F4310 Post-traumatic stress disorder, unspecified: Secondary | ICD-10-CM | POA: Diagnosis not present

## 2019-11-28 DIAGNOSIS — F4310 Post-traumatic stress disorder, unspecified: Secondary | ICD-10-CM | POA: Diagnosis not present

## 2019-12-08 DIAGNOSIS — M797 Fibromyalgia: Secondary | ICD-10-CM | POA: Diagnosis not present

## 2019-12-09 DIAGNOSIS — F4310 Post-traumatic stress disorder, unspecified: Secondary | ICD-10-CM | POA: Diagnosis not present

## 2019-12-12 DIAGNOSIS — R002 Palpitations: Secondary | ICD-10-CM | POA: Diagnosis not present

## 2019-12-12 DIAGNOSIS — I499 Cardiac arrhythmia, unspecified: Secondary | ICD-10-CM | POA: Diagnosis not present

## 2019-12-23 DIAGNOSIS — F4310 Post-traumatic stress disorder, unspecified: Secondary | ICD-10-CM | POA: Diagnosis not present

## 2020-01-05 DIAGNOSIS — M797 Fibromyalgia: Secondary | ICD-10-CM | POA: Diagnosis not present

## 2020-01-06 DIAGNOSIS — F4310 Post-traumatic stress disorder, unspecified: Secondary | ICD-10-CM | POA: Diagnosis not present

## 2020-01-19 DIAGNOSIS — J309 Allergic rhinitis, unspecified: Secondary | ICD-10-CM | POA: Diagnosis not present

## 2020-01-19 DIAGNOSIS — R599 Enlarged lymph nodes, unspecified: Secondary | ICD-10-CM | POA: Diagnosis not present

## 2020-01-19 DIAGNOSIS — R590 Localized enlarged lymph nodes: Secondary | ICD-10-CM | POA: Diagnosis not present

## 2020-02-12 DIAGNOSIS — M797 Fibromyalgia: Secondary | ICD-10-CM | POA: Diagnosis not present

## 2020-03-02 DIAGNOSIS — W540XXA Bitten by dog, initial encounter: Secondary | ICD-10-CM | POA: Diagnosis not present

## 2020-03-02 DIAGNOSIS — F4310 Post-traumatic stress disorder, unspecified: Secondary | ICD-10-CM | POA: Diagnosis not present

## 2020-03-02 DIAGNOSIS — S81852A Open bite, left lower leg, initial encounter: Secondary | ICD-10-CM | POA: Diagnosis not present

## 2020-03-16 DIAGNOSIS — F4310 Post-traumatic stress disorder, unspecified: Secondary | ICD-10-CM | POA: Diagnosis not present

## 2020-03-30 DIAGNOSIS — F4310 Post-traumatic stress disorder, unspecified: Secondary | ICD-10-CM | POA: Diagnosis not present

## 2020-04-11 DIAGNOSIS — F4310 Post-traumatic stress disorder, unspecified: Secondary | ICD-10-CM | POA: Diagnosis not present

## 2020-04-20 DIAGNOSIS — F4310 Post-traumatic stress disorder, unspecified: Secondary | ICD-10-CM | POA: Diagnosis not present

## 2020-04-28 DIAGNOSIS — F4310 Post-traumatic stress disorder, unspecified: Secondary | ICD-10-CM | POA: Diagnosis not present

## 2020-05-04 DIAGNOSIS — F4310 Post-traumatic stress disorder, unspecified: Secondary | ICD-10-CM | POA: Diagnosis not present

## 2020-05-11 DIAGNOSIS — F4310 Post-traumatic stress disorder, unspecified: Secondary | ICD-10-CM | POA: Diagnosis not present

## 2020-05-25 DIAGNOSIS — F4310 Post-traumatic stress disorder, unspecified: Secondary | ICD-10-CM | POA: Diagnosis not present

## 2020-06-08 DIAGNOSIS — F4310 Post-traumatic stress disorder, unspecified: Secondary | ICD-10-CM | POA: Diagnosis not present

## 2020-06-16 DIAGNOSIS — Z20822 Contact with and (suspected) exposure to covid-19: Secondary | ICD-10-CM | POA: Diagnosis not present

## 2020-06-16 DIAGNOSIS — G8929 Other chronic pain: Secondary | ICD-10-CM | POA: Diagnosis not present

## 2020-06-16 DIAGNOSIS — Z0000 Encounter for general adult medical examination without abnormal findings: Secondary | ICD-10-CM | POA: Diagnosis not present

## 2020-06-16 DIAGNOSIS — F419 Anxiety disorder, unspecified: Secondary | ICD-10-CM | POA: Diagnosis not present

## 2020-06-16 DIAGNOSIS — Z6827 Body mass index (BMI) 27.0-27.9, adult: Secondary | ICD-10-CM | POA: Diagnosis not present

## 2020-06-16 DIAGNOSIS — L409 Psoriasis, unspecified: Secondary | ICD-10-CM | POA: Diagnosis not present

## 2020-06-18 DIAGNOSIS — Z1231 Encounter for screening mammogram for malignant neoplasm of breast: Secondary | ICD-10-CM | POA: Diagnosis not present

## 2020-06-22 DIAGNOSIS — F4310 Post-traumatic stress disorder, unspecified: Secondary | ICD-10-CM | POA: Diagnosis not present

## 2020-06-27 DIAGNOSIS — R0981 Nasal congestion: Secondary | ICD-10-CM | POA: Diagnosis not present

## 2020-06-27 DIAGNOSIS — Z20822 Contact with and (suspected) exposure to covid-19: Secondary | ICD-10-CM | POA: Diagnosis not present

## 2020-06-30 DIAGNOSIS — Z1151 Encounter for screening for human papillomavirus (HPV): Secondary | ICD-10-CM | POA: Diagnosis not present

## 2020-06-30 DIAGNOSIS — Z124 Encounter for screening for malignant neoplasm of cervix: Secondary | ICD-10-CM | POA: Diagnosis not present

## 2020-06-30 DIAGNOSIS — Z809 Family history of malignant neoplasm, unspecified: Secondary | ICD-10-CM | POA: Diagnosis not present

## 2020-06-30 DIAGNOSIS — Z01419 Encounter for gynecological examination (general) (routine) without abnormal findings: Secondary | ICD-10-CM | POA: Diagnosis not present

## 2020-07-06 DIAGNOSIS — F4310 Post-traumatic stress disorder, unspecified: Secondary | ICD-10-CM | POA: Diagnosis not present

## 2020-07-17 DIAGNOSIS — Z20822 Contact with and (suspected) exposure to covid-19: Secondary | ICD-10-CM | POA: Diagnosis not present

## 2020-07-20 DIAGNOSIS — F4310 Post-traumatic stress disorder, unspecified: Secondary | ICD-10-CM | POA: Diagnosis not present

## 2020-08-03 DIAGNOSIS — F4310 Post-traumatic stress disorder, unspecified: Secondary | ICD-10-CM | POA: Diagnosis not present

## 2020-08-05 DIAGNOSIS — Z20822 Contact with and (suspected) exposure to covid-19: Secondary | ICD-10-CM | POA: Diagnosis not present

## 2020-08-17 DIAGNOSIS — F4310 Post-traumatic stress disorder, unspecified: Secondary | ICD-10-CM | POA: Diagnosis not present

## 2020-08-19 DIAGNOSIS — Z7183 Encounter for nonprocreative genetic counseling: Secondary | ICD-10-CM | POA: Diagnosis not present

## 2020-08-19 DIAGNOSIS — Z803 Family history of malignant neoplasm of breast: Secondary | ICD-10-CM | POA: Diagnosis not present

## 2020-08-20 DIAGNOSIS — F4310 Post-traumatic stress disorder, unspecified: Secondary | ICD-10-CM | POA: Diagnosis not present

## 2020-09-13 DIAGNOSIS — R112 Nausea with vomiting, unspecified: Secondary | ICD-10-CM | POA: Diagnosis not present

## 2020-09-13 DIAGNOSIS — Z20822 Contact with and (suspected) exposure to covid-19: Secondary | ICD-10-CM | POA: Diagnosis not present

## 2020-10-19 DIAGNOSIS — F4310 Post-traumatic stress disorder, unspecified: Secondary | ICD-10-CM | POA: Diagnosis not present

## 2020-10-26 DIAGNOSIS — Z20822 Contact with and (suspected) exposure to covid-19: Secondary | ICD-10-CM | POA: Diagnosis not present

## 2020-10-27 DIAGNOSIS — F4310 Post-traumatic stress disorder, unspecified: Secondary | ICD-10-CM | POA: Diagnosis not present

## 2020-10-28 DIAGNOSIS — Z20822 Contact with and (suspected) exposure to covid-19: Secondary | ICD-10-CM | POA: Diagnosis not present

## 2020-11-03 DIAGNOSIS — Z20822 Contact with and (suspected) exposure to covid-19: Secondary | ICD-10-CM | POA: Diagnosis not present

## 2020-11-03 DIAGNOSIS — Z20828 Contact with and (suspected) exposure to other viral communicable diseases: Secondary | ICD-10-CM | POA: Diagnosis not present

## 2020-11-05 DIAGNOSIS — Z20822 Contact with and (suspected) exposure to covid-19: Secondary | ICD-10-CM | POA: Diagnosis not present

## 2020-11-05 DIAGNOSIS — J019 Acute sinusitis, unspecified: Secondary | ICD-10-CM | POA: Diagnosis not present

## 2020-11-09 DIAGNOSIS — F4310 Post-traumatic stress disorder, unspecified: Secondary | ICD-10-CM | POA: Diagnosis not present

## 2020-11-23 DIAGNOSIS — F4310 Post-traumatic stress disorder, unspecified: Secondary | ICD-10-CM | POA: Diagnosis not present

## 2020-12-07 DIAGNOSIS — F4310 Post-traumatic stress disorder, unspecified: Secondary | ICD-10-CM | POA: Diagnosis not present

## 2020-12-21 DIAGNOSIS — F4310 Post-traumatic stress disorder, unspecified: Secondary | ICD-10-CM | POA: Diagnosis not present

## 2021-01-18 DIAGNOSIS — J309 Allergic rhinitis, unspecified: Secondary | ICD-10-CM | POA: Diagnosis not present

## 2021-01-18 DIAGNOSIS — R590 Localized enlarged lymph nodes: Secondary | ICD-10-CM | POA: Diagnosis not present

## 2021-01-18 DIAGNOSIS — F4310 Post-traumatic stress disorder, unspecified: Secondary | ICD-10-CM | POA: Diagnosis not present

## 2021-01-18 DIAGNOSIS — K219 Gastro-esophageal reflux disease without esophagitis: Secondary | ICD-10-CM | POA: Diagnosis not present

## 2021-02-15 DIAGNOSIS — F4310 Post-traumatic stress disorder, unspecified: Secondary | ICD-10-CM | POA: Diagnosis not present

## 2021-03-08 DIAGNOSIS — F4310 Post-traumatic stress disorder, unspecified: Secondary | ICD-10-CM | POA: Diagnosis not present

## 2021-03-09 DIAGNOSIS — L409 Psoriasis, unspecified: Secondary | ICD-10-CM | POA: Diagnosis not present

## 2021-03-09 DIAGNOSIS — J0100 Acute maxillary sinusitis, unspecified: Secondary | ICD-10-CM | POA: Diagnosis not present

## 2021-03-09 DIAGNOSIS — H9201 Otalgia, right ear: Secondary | ICD-10-CM | POA: Diagnosis not present

## 2021-03-09 DIAGNOSIS — F331 Major depressive disorder, recurrent, moderate: Secondary | ICD-10-CM | POA: Diagnosis not present

## 2021-03-09 DIAGNOSIS — R059 Cough, unspecified: Secondary | ICD-10-CM | POA: Diagnosis not present

## 2021-03-25 DIAGNOSIS — F4310 Post-traumatic stress disorder, unspecified: Secondary | ICD-10-CM | POA: Diagnosis not present

## 2021-04-08 DIAGNOSIS — F4310 Post-traumatic stress disorder, unspecified: Secondary | ICD-10-CM | POA: Diagnosis not present

## 2021-04-19 DIAGNOSIS — F4310 Post-traumatic stress disorder, unspecified: Secondary | ICD-10-CM | POA: Diagnosis not present

## 2021-05-10 DIAGNOSIS — F4310 Post-traumatic stress disorder, unspecified: Secondary | ICD-10-CM | POA: Diagnosis not present

## 2021-08-01 ENCOUNTER — Ambulatory Visit: Payer: Managed Care, Other (non HMO) | Admitting: Internal Medicine

## 2021-08-02 ENCOUNTER — Ambulatory Visit (INDEPENDENT_AMBULATORY_CARE_PROVIDER_SITE_OTHER): Payer: BC Managed Care – PPO | Admitting: Internal Medicine

## 2021-08-02 ENCOUNTER — Other Ambulatory Visit: Payer: Self-pay

## 2021-08-02 ENCOUNTER — Encounter: Payer: Self-pay | Admitting: Internal Medicine

## 2021-08-02 VITALS — BP 124/70 | HR 62 | Resp 18 | Ht 66.0 in | Wt 182.0 lb

## 2021-08-02 DIAGNOSIS — Z0000 Encounter for general adult medical examination without abnormal findings: Secondary | ICD-10-CM

## 2021-08-02 DIAGNOSIS — Z8349 Family history of other endocrine, nutritional and metabolic diseases: Secondary | ICD-10-CM

## 2021-08-02 DIAGNOSIS — L409 Psoriasis, unspecified: Secondary | ICD-10-CM

## 2021-08-02 DIAGNOSIS — Z803 Family history of malignant neoplasm of breast: Secondary | ICD-10-CM

## 2021-08-02 DIAGNOSIS — J3089 Other allergic rhinitis: Secondary | ICD-10-CM | POA: Diagnosis not present

## 2021-08-02 MED ORDER — CLOBETASOL PROPIONATE 0.05 % EX SOLN: 1.0000 "application " | Freq: Two times a day (BID) | CUTANEOUS | 5 refills | Status: DC

## 2021-08-02 NOTE — Progress Notes (Signed)
   Subjective:   Patient ID: Ashley Bond, female    DOB: 12-Jul-1991, 30 y.o.   MRN: [id number]  HPI The patient is a 30 YO female coming in new for ongoing care. Moved back to area. Also would like physical.   PMH, [place], social history reviewed and updated  Review of Systems  Constitutional: Negative.   HENT: Negative.    Eyes: Negative.   Respiratory:  Negative for cough, chest tightness and shortness of breath.   Cardiovascular:  Negative for chest pain, palpitations and leg swelling.  Gastrointestinal:  Negative for abdominal distention, abdominal pain, constipation, diarrhea, nausea and vomiting.  Musculoskeletal: Negative.   Skin:  Positive for rash.  Neurological: Negative.   Psychiatric/Behavioral: Negative.     Objective:  Physical Exam Constitutional:      Appearance: She is well-developed.  HENT:     Head: Normocephalic and atraumatic.  Cardiovascular:     Rate and Rhythm: Normal rate and regular rhythm.  Pulmonary:     Effort: Pulmonary effort is normal. No respiratory distress.     Breath sounds: Normal breath sounds. No wheezing or rales.  Abdominal:     General: Bowel sounds are normal. There is no distension.     Palpations: Abdomen is soft.     Tenderness: There is no abdominal tenderness. There is no rebound.  Musculoskeletal:     Cervical back: Normal range of motion.  Skin:    General: Skin is warm and dry.  Neurological:     Mental Status: She is alert and oriented to person, place, and time.     Coordination: Coordination normal.    Vitals:   08/02/21 1534  BP: 124/70  Pulse: 62  Resp: 18  SpO2: 98%  Weight: 182 lb (82.6 kg)  Height: 5\' 6"  (1.676 m)    This visit occurred during the SARS-CoV-2 public health emergency.  Safety protocols were in place, including screening questions prior to the visit, additional usage of staff PPE, and extensive cleaning of exam room while observing appropriate contact time as indicated for disinfecting  solutions.   Assessment & Plan:

## 2021-08-02 NOTE — Patient Instructions (Signed)
We will check the labs today. 

## 2021-08-03 LAB — LIPID PANEL
Cholesterol: 178 mg/dL (ref 0–200)
HDL: 105.4 mg/dL (ref >39.00)
LDL Cholesterol: 65 mg/dL (ref 0–99)
NonHDL: 72.44
Total CHOL/HDL Ratio: 2
Triglycerides: 39 mg/dL (ref 0.0–149.0)
VLDL: 7.8 mg/dL (ref 0.0–40.0)

## 2021-08-03 LAB — COMPREHENSIVE METABOLIC PANEL
ALT: 13 U/L (ref 0–35)
AST: 17 U/L (ref 0–37)
Albumin: 4.6 g/dL (ref 3.5–5.2)
Alkaline Phosphatase: 44 U/L (ref 39–117)
BUN: 8 mg/dL (ref 6–23)
CO2: 28 mEq/L (ref 19–32)
Calcium: 9.6 mg/dL (ref 8.4–10.5)
Chloride: 103 mEq/L (ref 96–112)
Creatinine, Ser: 0.78 mg/dL (ref 0.40–1.20)
GFR: 100.26 mL/min (ref >60.00)
Glucose, Bld: 92 mg/dL (ref 70–99)
Potassium: 3.9 mEq/L (ref 3.5–5.1)
Sodium: 138 mEq/L (ref 135–145)
Total Bilirubin: 0.3 mg/dL (ref 0.2–1.2)
Total Protein: 7.1 g/dL (ref 6.0–8.3)

## 2021-08-03 LAB — CBC
HCT: 36.3 % (ref 36.0–46.0)
Hemoglobin: 12.3 g/dL (ref 12.0–15.0)
MCHC: 33.8 g/dL (ref 30.0–36.0)
MCV: 91 fl (ref 78.0–100.0)
Platelets: 284 10*3/uL (ref 150.0–400.0)
RBC: 3.98 Mil/uL (ref 3.87–5.11)
RDW: 12.7 % (ref 11.5–15.5)
WBC: 7.2 10*3/uL (ref 4.0–10.5)

## 2021-08-03 LAB — TSH: TSH: 2.3 u[IU]/mL (ref 0.35–5.50)

## 2021-08-05 NOTE — Assessment & Plan Note (Signed)
Ordered mammogram for ongoing screening which she needs yearly. Has had biopsy in the past.

## 2021-08-05 NOTE — Assessment & Plan Note (Signed)
Rx for clobetasol ointment for the scalp where she has some recurrent issues. She has changed diet and overall has had marked clearance and was able to stop her humira injections.

## 2021-08-05 NOTE — Assessment & Plan Note (Signed)
Checking TSH and adjust as needed. No overt symptoms of low or high thyroid.

## 2021-08-05 NOTE — Assessment & Plan Note (Signed)
Flu shot complete for season. Covid-19 booster counseled. Tetanus up to date. Mammogram ordered, pap smear with gyn up to date. Counseled about sun safety and mole surveillance. Counseled about the dangers of distracted driving. Given 10 year screening recommendations.

## 2021-08-05 NOTE — Assessment & Plan Note (Signed)
Uses flonase and otc antihistamine as needed.

## 2021-10-04 ENCOUNTER — Telehealth: Payer: Self-pay

## 2021-10-04 ENCOUNTER — Telehealth: Payer: Self-pay | Admitting: Internal Medicine

## 2021-10-04 DIAGNOSIS — G4700 Insomnia, unspecified: Secondary | ICD-10-CM

## 2021-10-04 DIAGNOSIS — F419 Anxiety disorder, unspecified: Secondary | ICD-10-CM

## 2021-10-04 MED ORDER — TRAZODONE HCL 50 MG PO TABS: ORAL_TABLET | ORAL | 11 refills | Status: DC

## 2021-10-04 NOTE — Telephone Encounter (Signed)
1.Medication Requested: traZODone (DESYREL) 50 MG tablet  2. Pharmacy (Name, Street, [facility]): [facility] [id number] - [place], [place]  3. On Med List: yes  4. Last Visit with PCP: 08-02-2021  5. Next visit date with PCP: 10-18-2021   Agent: Please be advised that RX refills may take up to 3 business days. We ask that you follow-up with your pharmacy.

## 2021-10-04 NOTE — Telephone Encounter (Signed)
Medication has been refilled.

## 2021-10-04 NOTE — Telephone Encounter (Signed)
Pt requesting rx for traZODone (DESYREL) 50 MG tablet.  LOV: 08/02/2021

## 2021-10-18 ENCOUNTER — Ambulatory Visit: Payer: BC Managed Care – PPO | Admitting: Internal Medicine

## 2021-10-18 ENCOUNTER — Other Ambulatory Visit: Payer: Self-pay

## 2021-10-18 ENCOUNTER — Encounter: Payer: Self-pay | Admitting: Internal Medicine

## 2021-10-18 DIAGNOSIS — L409 Psoriasis, unspecified: Secondary | ICD-10-CM

## 2021-10-18 DIAGNOSIS — Z0000 Encounter for general adult medical examination without abnormal findings: Secondary | ICD-10-CM

## 2021-10-18 NOTE — Patient Instructions (Signed)
I think it is okay to exercise.

## 2021-10-18 NOTE — Progress Notes (Signed)
° °  Subjective:   Patient ID: Ashley Bond, female    DOB: Mar 26, 1991, 31 y.o.   MRN: [id number]  HPI The patient is a 31 YO female coming in for follow up.   Review of Systems  Constitutional: Negative.   HENT: Negative.    Eyes: Negative.   Respiratory:  Negative for cough, chest tightness and shortness of breath.   Cardiovascular:  Negative for chest pain, palpitations and leg swelling.  Gastrointestinal:  Negative for abdominal distention, abdominal pain, constipation, diarrhea, nausea and vomiting.  Musculoskeletal: Negative.   Skin:  Positive for color change.  Neurological: Negative.   Psychiatric/Behavioral: Negative.     Objective:  Physical Exam Constitutional:      Appearance: She is well-developed.  HENT:     Head: Normocephalic and atraumatic.  Cardiovascular:     Rate and Rhythm: Normal rate and regular rhythm.  Pulmonary:     Effort: Pulmonary effort is normal. No respiratory distress.     Breath sounds: Normal breath sounds. No wheezing or rales.  Abdominal:     General: Bowel sounds are normal. There is no distension.     Palpations: Abdomen is soft.     Tenderness: There is no abdominal tenderness. There is no rebound.  Musculoskeletal:     Cervical back: Normal range of motion.  Skin:    General: Skin is warm and dry.     Comments: Mole on neck, non-cancerous appearing.  Neurological:     Mental Status: She is alert and oriented to person, place, and time.     Coordination: Coordination normal.    Vitals:   10/18/21 1604  BP: 120/80  Pulse: 73  Resp: 18  SpO2: 98%  Weight: 184 lb 6.4 oz (83.6 kg)  Height: 5\' 6"  (1.676 m)    This visit occurred during the SARS-CoV-2 public health emergency.  Safety protocols were in place, including screening questions prior to the visit, additional usage of staff PPE, and extensive cleaning of exam room while observing appropriate contact time as indicated for disinfecting solutions.   Assessment & Plan:

## 2021-10-19 NOTE — Assessment & Plan Note (Signed)
Skin lesion examined and non-cancerous appearing. Reminded about sunscreen and protective clothes.

## 2021-10-19 NOTE — Assessment & Plan Note (Signed)
She is wishing to get back to running and we discussed HR goals and maximum sustained HR safe for her today.

## 2021-11-21 DIAGNOSIS — F4310 Post-traumatic stress disorder, unspecified: Secondary | ICD-10-CM | POA: Diagnosis not present

## 2021-11-24 DIAGNOSIS — F4310 Post-traumatic stress disorder, unspecified: Secondary | ICD-10-CM | POA: Diagnosis not present

## 2021-12-02 DIAGNOSIS — R41840 Attention and concentration deficit: Secondary | ICD-10-CM | POA: Diagnosis not present

## 2021-12-02 DIAGNOSIS — F4312 Post-traumatic stress disorder, chronic: Secondary | ICD-10-CM | POA: Diagnosis not present

## 2021-12-02 DIAGNOSIS — F419 Anxiety disorder, unspecified: Secondary | ICD-10-CM | POA: Diagnosis not present

## 2021-12-19 DIAGNOSIS — F4310 Post-traumatic stress disorder, unspecified: Secondary | ICD-10-CM | POA: Diagnosis not present

## 2022-01-16 DIAGNOSIS — F4310 Post-traumatic stress disorder, unspecified: Secondary | ICD-10-CM | POA: Diagnosis not present

## 2022-01-24 ENCOUNTER — Ambulatory Visit: Payer: BC Managed Care – PPO | Admitting: Internal Medicine

## 2022-01-31 ENCOUNTER — Other Ambulatory Visit (HOSPITAL_COMMUNITY)
Admission: RE | Admit: 2022-01-31 | Discharge: 2022-01-31 | Disposition: A | Discharge status: Home / Self Care | Payer: BC Managed Care – PPO | Source: Ambulatory Visit | Attending: Internal Medicine | Admitting: Internal Medicine

## 2022-01-31 ENCOUNTER — Ambulatory Visit: Payer: BC Managed Care – PPO | Admitting: Internal Medicine

## 2022-01-31 ENCOUNTER — Encounter: Payer: Self-pay | Admitting: Internal Medicine

## 2022-01-31 VITALS — BP 122/60 | HR 75 | Resp 18 | Ht 66.0 in | Wt 178.8 lb

## 2022-01-31 DIAGNOSIS — Z803 Family history of malignant neoplasm of breast: Secondary | ICD-10-CM

## 2022-01-31 DIAGNOSIS — Z124 Encounter for screening for malignant neoplasm of cervix: Secondary | ICD-10-CM | POA: Diagnosis not present

## 2022-01-31 DIAGNOSIS — N6310 Unspecified lump in the right breast, unspecified quadrant: Secondary | ICD-10-CM | POA: Diagnosis not present

## 2022-01-31 DIAGNOSIS — Z0000 Encounter for general adult medical examination without abnormal findings: Secondary | ICD-10-CM | POA: Diagnosis not present

## 2022-01-31 NOTE — Progress Notes (Signed)
? ?  Subjective:  ? ?Patient ID: Ashley Bond, female    DOB: December 29, 1990, 31 y.o.   MRN: [id number] ? ?HPI ?Due for pap smear, some lumps in breast strong family hx breast cancer.  ? ?Review of Systems  ?Constitutional: Negative.   ?HENT: Negative.    ?Eyes: Negative.   ?Respiratory:  Negative for cough, chest tightness and shortness of breath.   ?Cardiovascular:  Negative for chest pain, palpitations and leg swelling.  ?Gastrointestinal:  Negative for abdominal distention, abdominal pain, constipation, diarrhea, nausea and vomiting.  ?Genitourinary:   ?     Breast lump  ?Musculoskeletal: Negative.   ?Skin: Negative.   ?Neurological: Negative.   ?Psychiatric/Behavioral: Negative.    ? ?Objective:  ?Physical Exam ?Exam conducted with a chaperone present.  ?Constitutional:   ?   Appearance: She is well-developed.  ?HENT:  ?   Head: Normocephalic and atraumatic.  ?Cardiovascular:  ?   Rate and Rhythm: Normal rate and regular rhythm.  ?Pulmonary:  ?   Effort: Pulmonary effort is normal. No respiratory distress.  ?   Breath sounds: Normal breath sounds. No wheezing or rales.  ?   Comments: Breast exam done with breast tissue making detection of mass less sensitive.  ?Abdominal:  ?   General: Bowel sounds are normal. There is no distension.  ?   Palpations: Abdomen is soft.  ?   Tenderness: There is no abdominal tenderness. There is no rebound.  ?Genitourinary: ?   Comments: Pap smear collected ?Musculoskeletal:  ?   Cervical back: Normal range of motion.  ?Skin: ?   General: Skin is warm and dry.  ?Neurological:  ?   Mental Status: She is alert and oriented to person, place, and time.  ?   Coordination: Coordination normal.  ? ? ?Vitals:  ? 01/31/22 1552  ?BP: 122/60  ?Pulse: 75  ?Resp: 18  ?SpO2: 98%  ?Weight: 178 lb 12.8 oz (81.1 kg)  ?Height: '5\' 6"'$  (1.676 m)  ? ? ?This visit occurred during the SARS-CoV-2 public health emergency.  Safety protocols were in place, including screening questions prior to the visit,  additional usage of staff PPE, and extensive cleaning of exam room while observing appropriate contact time as indicated for disinfecting solutions.  ? ?Assessment & Plan:  ? ?

## 2022-02-01 ENCOUNTER — Encounter: Payer: Self-pay | Admitting: Internal Medicine

## 2022-02-01 DIAGNOSIS — L989 Disorder of the skin and subcutaneous tissue, unspecified: Secondary | ICD-10-CM

## 2022-02-01 DIAGNOSIS — N6310 Unspecified lump in the right breast, unspecified quadrant: Secondary | ICD-10-CM | POA: Insufficient documentation

## 2022-02-01 NOTE — Assessment & Plan Note (Signed)
Given new lump in breast needs mammogram ordered diagnostic. Has had prior biopsy in the past which was benign. Counseled about lowering caffeine intake.  ?

## 2022-02-01 NOTE — Assessment & Plan Note (Signed)
Pap smear done today and referral to ob/gyn as she needs IUD removed and replaced as it is time.  ?

## 2022-02-01 NOTE — Assessment & Plan Note (Signed)
There is no mass on exam but a lot of breast tissue making detection more difficult. Given personal and family history ordered diagnostic mammogram.  ?

## 2022-02-02 ENCOUNTER — Other Ambulatory Visit: Payer: Self-pay | Admitting: Internal Medicine

## 2022-02-02 DIAGNOSIS — N6310 Unspecified lump in the right breast, unspecified quadrant: Secondary | ICD-10-CM

## 2022-02-03 LAB — CYTOLOGY - PAP
Adequacy: ABSENT
Comment: NEGATIVE
Diagnosis: NEGATIVE
High risk HPV: NEGATIVE

## 2022-02-11 DIAGNOSIS — J018 Other acute sinusitis: Secondary | ICD-10-CM | POA: Diagnosis not present

## 2022-02-11 DIAGNOSIS — B9689 Other specified bacterial agents as the cause of diseases classified elsewhere: Secondary | ICD-10-CM | POA: Diagnosis not present

## 2022-02-12 ENCOUNTER — Other Ambulatory Visit: Payer: Self-pay | Admitting: Internal Medicine

## 2022-02-13 ENCOUNTER — Other Ambulatory Visit (HOSPITAL_COMMUNITY): Payer: Self-pay

## 2022-02-13 DIAGNOSIS — F4310 Post-traumatic stress disorder, unspecified: Secondary | ICD-10-CM | POA: Diagnosis not present

## 2022-02-13 MED ORDER — VALACYCLOVIR HCL 1 G PO TABS
1000.0000 mg | ORAL_TABLET | Freq: Two times a day (BID) | ORAL | 0 refills | Status: AC
Start: 2022-02-13 — End: ?
  Filled 2022-02-13: qty 180, 90d supply, fill #0

## 2022-03-07 DIAGNOSIS — F4310 Post-traumatic stress disorder, unspecified: Secondary | ICD-10-CM | POA: Diagnosis not present

## 2022-03-23 DIAGNOSIS — F4310 Post-traumatic stress disorder, unspecified: Secondary | ICD-10-CM | POA: Diagnosis not present

## 2022-03-28 ENCOUNTER — Ambulatory Visit: Payer: BC Managed Care – PPO | Admitting: Internal Medicine

## 2022-03-29 ENCOUNTER — Ambulatory Visit
Admission: RE | Admit: 2022-03-29 | Discharge: 2022-03-29 | Disposition: A | Discharge status: Home / Self Care | Payer: BC Managed Care – PPO | Source: Ambulatory Visit | Attending: Internal Medicine | Admitting: Internal Medicine

## 2022-03-29 ENCOUNTER — Ambulatory Visit: Admission: RE | Admit: 2022-03-29 | Payer: BC Managed Care – PPO | Source: Ambulatory Visit

## 2022-03-29 DIAGNOSIS — R922 Inconclusive mammogram: Secondary | ICD-10-CM | POA: Diagnosis not present

## 2022-03-29 DIAGNOSIS — N6310 Unspecified lump in the right breast, unspecified quadrant: Secondary | ICD-10-CM

## 2022-03-30 ENCOUNTER — Ambulatory Visit: Payer: BC Managed Care – PPO | Admitting: Obstetrics & Gynecology

## 2022-03-30 DIAGNOSIS — R590 Localized enlarged lymph nodes: Secondary | ICD-10-CM | POA: Diagnosis not present

## 2022-03-30 DIAGNOSIS — J309 Allergic rhinitis, unspecified: Secondary | ICD-10-CM | POA: Diagnosis not present

## 2022-03-31 ENCOUNTER — Encounter: Payer: Self-pay | Admitting: Internal Medicine

## 2022-03-31 ENCOUNTER — Ambulatory Visit: Payer: BC Managed Care – PPO | Admitting: Internal Medicine

## 2022-03-31 DIAGNOSIS — D234 Other benign neoplasm of skin of scalp and neck: Secondary | ICD-10-CM | POA: Diagnosis not present

## 2022-03-31 DIAGNOSIS — L409 Psoriasis, unspecified: Secondary | ICD-10-CM

## 2022-03-31 MED ORDER — ADAPALENE 0.3 % EX GEL
CUTANEOUS | 0 refills | Status: DC
Start: 2022-03-31 — End: 2023-06-04

## 2022-03-31 NOTE — Progress Notes (Signed)
   Subjective:   Patient ID: Ashley Bond, female    DOB: 1991-03-31, 31 y.o.   MRN: [id number]  HPI The patient is a 31 YO female coming in for cyst on the head and psoriasis of the scalp.  Review of Systems  Constitutional: Negative.   HENT: Negative.    Eyes: Negative.   Respiratory:  Negative for cough, chest tightness and shortness of breath.   Cardiovascular:  Negative for chest pain, palpitations and leg swelling.  Gastrointestinal:  Negative for abdominal distention, abdominal pain, constipation, diarrhea, nausea and vomiting.  Musculoskeletal: Negative.   Skin:  Positive for rash.  Neurological: Negative.   Psychiatric/Behavioral: Negative.      Objective:  Physical Exam Constitutional:      Appearance: She is well-developed.  HENT:     Head: Normocephalic and atraumatic.  Cardiovascular:     Rate and Rhythm: Normal rate and regular rhythm.  Pulmonary:     Effort: Pulmonary effort is normal. No respiratory distress.     Breath sounds: Normal breath sounds. No wheezing or rales.  Abdominal:     General: Bowel sounds are normal. There is no distension.     Palpations: Abdomen is soft.     Tenderness: There is no abdominal tenderness. There is no rebound.  Musculoskeletal:     Cervical back: Normal range of motion.  Skin:    General: Skin is warm and dry.     Comments: Cyst posterior scalp midline without infection freely mobile  Neurological:     Mental Status: She is alert and oriented to person, place, and time.     Coordination: Coordination normal.     Vitals:   03/31/22 1040  BP: 110/72  Pulse: 93  Temp: 98.3 F (36.8 C)  TempSrc: Oral  SpO2: 99%  Weight: 174 lb 8 oz (79.2 kg)  Height: '5\' 6"'$  (1.676 m)    Assessment & Plan:

## 2022-03-31 NOTE — Assessment & Plan Note (Signed)
Rx adapalene 0.3% gel which she has used with success for scalp in the past.

## 2022-03-31 NOTE — Assessment & Plan Note (Signed)
Examined and reassurance given. No indication of infection today. Advised this could be electively removed by dermatology or surgeon.

## 2022-04-13 DIAGNOSIS — F4310 Post-traumatic stress disorder, unspecified: Secondary | ICD-10-CM | POA: Diagnosis not present

## 2022-04-24 ENCOUNTER — Ambulatory Visit: Payer: BC Managed Care – PPO | Admitting: Advanced Practice Midwife

## 2022-04-24 ENCOUNTER — Encounter: Payer: Self-pay | Admitting: Advanced Practice Midwife

## 2022-04-24 VITALS — BP 125/78 | HR 69 | Ht 66.5 in | Wt 175.0 lb

## 2022-04-24 DIAGNOSIS — Z975 Presence of (intrauterine) contraceptive device: Secondary | ICD-10-CM

## 2022-04-24 DIAGNOSIS — Z30430 Encounter for insertion of intrauterine contraceptive device: Secondary | ICD-10-CM | POA: Diagnosis not present

## 2022-04-24 DIAGNOSIS — Z30433 Encounter for removal and reinsertion of intrauterine contraceptive device: Secondary | ICD-10-CM | POA: Diagnosis not present

## 2022-04-24 MED ORDER — LEVONORGESTREL 20.1 MCG/DAY IU IUD
1.0000 | INTRAUTERINE_SYSTEM | Freq: Once | INTRAUTERINE | Status: AC
  Administered 2022-04-24: 1 via INTRAUTERINE

## 2022-04-24 NOTE — Progress Notes (Signed)
   GYNECOLOGY OFFICE PROCEDURE NOTE  Ashley Bond is a 31 y.o. G0P0000 here for [place] IUD insertion. No GYN concerns.  Last pap smear was on 01/31/22 and was normal.   IUD Removal  Patient was in the dorsal lithotomy position, normal external genitalia was noted.  A speculum was placed in the patient's vagina, normal discharge was noted, no lesions. The multiparous cervix was visualized, no lesions, no abnormal discharge.  The strings of the IUD were grasped and pulled using ring forceps. The IUD was removed in its entirety. Patient tolerated the procedure well.     IUD Insertion Procedure Note Patient identified, informed consent performed, consent signed.   Discussed risks of irregular bleeding, cramping, infection, malpositioning or misplacement of the IUD outside the uterus which may require further procedure such as laparoscopy. Time out was performed.    Speculum placed in the vagina.  Cervix visualized.  Cleaned with chlorhexidine, given shellfish allergy.  Grasped anteriorly with a single tooth tenaculum.  Uterus sounded to 7 cm.  Liletta IUD placed per manufacturer's recommendations.  Strings trimmed to 3 cm. Tenaculum was removed, good hemostasis noted.  Patient tolerated procedure well.   Patient was given post-procedure instructions.  She was advised to have backup contraception for one week.  Patient was also asked to check IUD strings periodically and follow up in 4 weeks for IUD check.  Return in about 4 weeks (around 05/22/2022) for string check.   Fatima Blank, CNM 5:41 PM

## 2022-04-25 ENCOUNTER — Ambulatory Visit: Payer: BC Managed Care – PPO | Admitting: Internal Medicine

## 2022-04-25 ENCOUNTER — Encounter: Payer: Self-pay | Admitting: Internal Medicine

## 2022-04-25 VITALS — BP 122/80 | HR 77 | Resp 18 | Ht 66.5 in | Wt 178.4 lb

## 2022-04-25 DIAGNOSIS — Z91018 Allergy to other foods: Secondary | ICD-10-CM | POA: Diagnosis not present

## 2022-04-25 DIAGNOSIS — R21 Rash and other nonspecific skin eruption: Secondary | ICD-10-CM

## 2022-04-25 DIAGNOSIS — L409 Psoriasis, unspecified: Secondary | ICD-10-CM | POA: Diagnosis not present

## 2022-04-25 MED ORDER — SELENIUM SULFIDE 2.5 % EX LOTN: 1.0000 | TOPICAL_LOTION | Freq: Every day | CUTANEOUS | 12 refills | Status: DC | PRN

## 2022-04-25 MED ORDER — TRIAMCINOLONE ACETONIDE 0.1 % EX CREA: 1.0000 | TOPICAL_CREAM | Freq: Two times a day (BID) | CUTANEOUS | 0 refills | Status: AC

## 2022-04-25 MED ORDER — METHYLPREDNISOLONE ACETATE 40 MG/ML IJ SUSP
40.0000 mg | Freq: Once | INTRAMUSCULAR | Status: AC
  Administered 2022-04-25: 40 mg via INTRAMUSCULAR

## 2022-04-25 NOTE — Patient Instructions (Signed)
We have given you the shot.   We have sent in the shampoo and the cream. Okay to increase xyzal to 1 pill 3 times a day for next 1-2 weeks.  We will check the food allergy blood test.

## 2022-04-25 NOTE — Progress Notes (Signed)
   Subjective:   Patient ID: Ashley Bond, female    DOB: 02-14-1991, 31 y.o.   MRN: [id number]  HPI The patient is a 31 YO female coming in for hives and rash since eating tangerines. Tried benadryl and this is not helping.  Review of Systems  Constitutional: Negative.   HENT: Negative.    Eyes: Negative.   Respiratory:  Negative for cough, chest tightness and shortness of breath.   Cardiovascular:  Negative for chest pain, palpitations and leg swelling.  Gastrointestinal:  Negative for abdominal distention, abdominal pain, constipation, diarrhea, nausea and vomiting.  Musculoskeletal: Negative.   Skin:  Positive for rash.  Neurological: Negative.   Psychiatric/Behavioral: Negative.      Objective:  Physical Exam Constitutional:      Appearance: She is well-developed.  HENT:     Head: Normocephalic and atraumatic.  Eyes:     Comments: Some periorbital swelling, no throat or lip/tongue swelling  Cardiovascular:     Rate and Rhythm: Normal rate and regular rhythm.  Pulmonary:     Effort: Pulmonary effort is normal. No respiratory distress.     Breath sounds: Normal breath sounds. No wheezing or rales.  Abdominal:     General: Bowel sounds are normal. There is no distension.     Palpations: Abdomen is soft.     Tenderness: There is no abdominal tenderness. There is no rebound.  Musculoskeletal:     Cervical back: Normal range of motion.  Skin:    General: Skin is warm and dry.     Comments: Some hives on the arms  Neurological:     Mental Status: She is alert and oriented to person, place, and time.     Coordination: Coordination normal.     Vitals:   04/25/22 1530  BP: 122/80  Pulse: 77  Resp: 18  SpO2: 99%  Weight: 178 lb 6.4 oz (80.9 kg)  Height: 5' 6.5" (1.689 m)    Assessment & Plan:  Depo-medrol 40 mg IM

## 2022-04-26 ENCOUNTER — Encounter: Payer: Self-pay | Admitting: Internal Medicine

## 2022-04-26 DIAGNOSIS — Z91018 Allergy to other foods: Secondary | ICD-10-CM | POA: Insufficient documentation

## 2022-04-26 LAB — FOOD ALLERGY PROFILE
Allergen, Salmon, f41: <0.1 kU/L
Almonds: 0.28 kU/L — ABNORMAL HIGH
CLASS: 0
CLASS: 0
CLASS: 0
CLASS: 0
CLASS: 0
CLASS: 0
CLASS: 0
CLASS: 0
CLASS: 1
CLASS: 1
Cashew IgE: <0.1 kU/L
Class: 0
Class: 0
Class: 0
Egg White IgE: <0.1 kU/L
Fish Cod: <0.1 kU/L
Hazelnut: 0.65 kU/L — ABNORMAL HIGH
Milk IgE: <0.1 kU/L
Peanut IgE: 0.11 kU/L — ABNORMAL HIGH
Scallop IgE: <0.1 kU/L
Sesame Seed f10: <0.1 kU/L
Shrimp IgE: <0.1 kU/L
Soybean IgE: <0.1 kU/L
Tuna IgE: <0.1 kU/L
Walnut: 0.37 kU/L — ABNORMAL HIGH
Wheat IgE: <0.1 kU/L

## 2022-04-26 LAB — INTERPRETATION:

## 2022-04-26 NOTE — Assessment & Plan Note (Signed)
Ordered food allergy serum testing to assess for known food allergens. We talked about oral allergy from pollen and if this panel is negative to start washing fruits with vinegar to help.

## 2022-04-26 NOTE — Assessment & Plan Note (Signed)
Needs scalp solution and given rx selenium sulfide 2.5 % to use.

## 2022-04-26 NOTE — Assessment & Plan Note (Signed)
Suspect allergic and given depo-medrol 40 mg IM today and encouraged to increase xyzal to 1 pill TID otc to help for next few days.

## 2022-04-27 MED ORDER — LEVOCETIRIZINE DIHYDROCHLORIDE 5 MG PO TABS: 5.0000 mg | ORAL_TABLET | Freq: Every day | ORAL | 3 refills | Status: AC

## 2022-05-04 ENCOUNTER — Telehealth: Payer: Self-pay | Admitting: Internal Medicine

## 2022-05-04 DIAGNOSIS — F4310 Post-traumatic stress disorder, unspecified: Secondary | ICD-10-CM | POA: Diagnosis not present

## 2022-05-04 DIAGNOSIS — R220 Localized swelling, mass and lump, head: Secondary | ICD-10-CM | POA: Diagnosis not present

## 2022-05-04 DIAGNOSIS — R21 Rash and other nonspecific skin eruption: Secondary | ICD-10-CM | POA: Diagnosis not present

## 2022-05-04 DIAGNOSIS — T7840XA Allergy, unspecified, initial encounter: Secondary | ICD-10-CM | POA: Diagnosis not present

## 2022-05-04 MED ORDER — PREDNISONE 20 MG PO TABS: 40.0000 mg | ORAL_TABLET | Freq: Every day | ORAL | 0 refills | Status: DC

## 2022-05-04 NOTE — Telephone Encounter (Signed)
Called number that was provided and that number is incorrect. Patient is active on mychart as of today, mychart message has been sent with Dr. Nathanial Millman recommendations

## 2022-05-04 NOTE — Telephone Encounter (Signed)
Pt stated her face is swollen,allergy symptons are back would like Rx called in.   LOV-04/25/22

## 2022-05-04 NOTE — Telephone Encounter (Signed)
Sent in prednisone to take 2 pills daily for 5 days. Alternative is to take xyzal 1 pill TID for next 3 days to help.

## 2022-05-08 ENCOUNTER — Other Ambulatory Visit: Payer: Self-pay

## 2022-05-22 ENCOUNTER — Ambulatory Visit: Payer: BC Managed Care – PPO | Admitting: Advanced Practice Midwife

## 2022-05-22 ENCOUNTER — Encounter: Payer: Self-pay | Admitting: Advanced Practice Midwife

## 2022-05-22 VITALS — BP 134/94 | HR 74 | Ht 67.0 in | Wt 183.0 lb

## 2022-05-22 DIAGNOSIS — Z30431 Encounter for routine checking of intrauterine contraceptive device: Secondary | ICD-10-CM

## 2022-05-22 NOTE — Progress Notes (Unsigned)
   GYNECOLOGY CLINIC PROGRESS NOTE  History:  31 y.o. G0P0000 here at [facility] today for today for IUD string check; Liletta IUD was placed  04/24/22. No complaints about the IUD, no concerning side effects.  The following portions of the patient's history were reviewed and updated as appropriate: allergies, current medications, past family history, past medical history, past social history, past surgical history and problem list. Last pap smear on 01/31/22 was normal, negative HRHPV.  Review of Systems:  Pertinent items are noted in HPI.   Objective:  Physical Exam Blood pressure (!) 134/94, pulse 74, height '5\' 7"'$  (1.702 m), weight 183 lb (83 kg). Gen: NAD Abd: Soft, nontender and nondistended Pelvic: Normal appearing external genitalia; normal appearing vaginal mucosa and cervix.  IUD strings visualized, about 3 cm in length outside cervix.   Assessment & Plan:  Normal IUD check. Patient to keep IUD in place for 7-8 years; can come in for removal if she desires pregnancy within the next 8 years. Routine preventative health maintenance measures emphasized.  No follow-ups on file.   Fatima Blank, CNM 3:56 PM

## 2022-05-22 NOTE — Progress Notes (Unsigned)
Pt presents for IUD string check.  Insertion on 04/24/22. Reports no issues.

## 2022-05-25 DIAGNOSIS — F4310 Post-traumatic stress disorder, unspecified: Secondary | ICD-10-CM | POA: Diagnosis not present

## 2022-05-30 DIAGNOSIS — F4310 Post-traumatic stress disorder, unspecified: Secondary | ICD-10-CM | POA: Diagnosis not present

## 2022-06-13 DIAGNOSIS — F4310 Post-traumatic stress disorder, unspecified: Secondary | ICD-10-CM | POA: Diagnosis not present

## 2022-07-05 ENCOUNTER — Telehealth (INDEPENDENT_AMBULATORY_CARE_PROVIDER_SITE_OTHER): Payer: BC Managed Care – PPO | Admitting: Internal Medicine

## 2022-07-05 ENCOUNTER — Encounter: Payer: Self-pay | Admitting: Internal Medicine

## 2022-07-05 DIAGNOSIS — J019 Acute sinusitis, unspecified: Secondary | ICD-10-CM

## 2022-07-05 MED ORDER — AMOXICILLIN-POT CLAVULANATE 875-125 MG PO TABS: 1.0000 | ORAL_TABLET | Freq: Two times a day (BID) | ORAL | 0 refills | Status: DC

## 2022-07-05 NOTE — Progress Notes (Signed)
Virtual Visit via Video Note  I connected with Ashley Bond on 07/05/22 at  3:40 PM EDT by a video enabled telemedicine application and verified that I am speaking with the correct person using two identifiers.   I discussed the limitations of evaluation and management by telemedicine and the availability of in person appointments. The patient expressed understanding and agreed to proceed.  Present for the visit:  Myself, Dr Billey Gosling, Adella Nissen.  The patient is currently at home and I am in the office.    No referring provider.    History of Present Illness: She is here for an acute visit for cold symptoms.   Her symptoms started 4-5 days ago.  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  She did test herself for COVID and she was negative.  She is experiencing chills, fatigue, congestion, her ears feel clogged, sinus pressure and pain, swollen glands, productive cough, slight shortness of breath, chest pain from coughing, mild dizziness and mild headaches.  She has tried taking Mucinex D, her allergy medications which she has increased and the nasal sprays.  She does have frequent sinus infections.   Review of Systems  Constitutional:  Positive for chills and malaise/fatigue. Negative for fever.  HENT:  Positive for congestion, ear pain (ears clogged) and sinus pain (mild). Negative for sore throat.        Swollen glands  Respiratory:  Positive for cough, sputum production and shortness of breath (slight). Negative for wheezing.   Cardiovascular:  Positive for chest pain (from coughing).  Neurological:  Positive for dizziness (mild) and headaches (mild).      Social History   Socioeconomic History   Marital status: Married    Spouse name: Not on file   Number of children: Not on file   Years of education: Not on file   Highest education level: Not on file  Occupational History   Not on file  Tobacco Use   Smoking status: Never    Smokeless tobacco: Never  Substance and Sexual Activity   Alcohol use: Yes    Alcohol/week: 0.0 standard drinks of alcohol    Comment: A COUPLE DRINKS NIGHTLY    Drug use: No   Sexual activity: Yes    Birth control/protection: Inserts  Other Topics Concern   Not on file  Social History Narrative   Not on file   Social Determinants of Health   Financial Resource Strain: Not on file  Food Insecurity: Not on file  Transportation Needs: Not on file  Physical Activity: Not on file  Stress: Not on file  Social Connections: Not on file     Observations/Objective: Appears well in NAD Sounds congested Breathing normally Skin appears warm and dry  Assessment and Plan:  Acute sinus infection: Acute Likely bacterial  Start Augmentin 875-125 mg BID x 10 day otc cold medications, allergy medications, nasal sprays, inhalers Rest, fluid Call if no improvement    Follow Up Instructions:    I discussed the assessment and treatment plan with the patient. The patient was provided an opportunity to ask questions and all were answered. The patient agreed with the plan and demonstrated an understanding of the instructions.   The patient was advised to call back or seek an in-person evaluation if the symptoms worsen or if the condition fails to improve as anticipated.    Binnie Rail, MD

## 2022-07-11 DIAGNOSIS — F4310 Post-traumatic stress disorder, unspecified: Secondary | ICD-10-CM | POA: Diagnosis not present

## 2022-07-23 ENCOUNTER — Ambulatory Visit (HOSPITAL_COMMUNITY)
Admission: EM | Admit: 2022-07-23 | Discharge: 2022-07-23 | Disposition: A | Discharge status: Home / Self Care | Payer: BC Managed Care – PPO | Attending: Physician Assistant | Admitting: Physician Assistant

## 2022-07-23 ENCOUNTER — Encounter (HOSPITAL_COMMUNITY): Payer: Self-pay | Admitting: *Deleted

## 2022-07-23 ENCOUNTER — Other Ambulatory Visit: Payer: Self-pay

## 2022-07-23 DIAGNOSIS — T7840XA Allergy, unspecified, initial encounter: Secondary | ICD-10-CM | POA: Diagnosis not present

## 2022-07-23 MED ORDER — METHYLPREDNISOLONE SODIUM SUCC 125 MG IJ SOLR
INTRAMUSCULAR | Status: AC
  Filled 2022-07-23: qty 2

## 2022-07-23 MED ORDER — EPINEPHRINE 0.3 MG/0.3ML IJ SOAJ: 0.3000 mg | INTRAMUSCULAR | 0 refills | Status: AC | PRN

## 2022-07-23 MED ORDER — METHYLPREDNISOLONE SODIUM SUCC 125 MG IJ SOLR
80.0000 mg | Freq: Once | INTRAMUSCULAR | Status: AC
  Administered 2022-07-23: 80 mg via INTRAMUSCULAR

## 2022-07-23 NOTE — ED Triage Notes (Signed)
T reports she was out for a run for 10 mins. Pt reports swellingto face and hands . Pt has all over body itching and Pt reports her throat feels tight.

## 2022-07-23 NOTE — ED Provider Notes (Signed)
[facility]    CSN: [id number] Arrival date & time: 07/23/22  1652      History   Chief Complaint Chief Complaint  Patient presents with   Allergic Reaction    HPI Ashley Bond is a 31 y.o. female.   Patient here today for evaluation of swelling to face and hands and itching that started today after she went for about a 10 minute run. She notes she did eat a salad before- she is not sure if something in the salad triggered symptoms. She has not had any trouble breathing, no trouble swallowing. She took benadryl but then vomited so is unsure if this has helped. She does have EpiPen but this is expired.   The history is provided by the patient.  Allergic Reaction Presenting symptoms: no difficulty swallowing and no wheezing     Past Medical History:  Diagnosis Date   Allergy    Anxiety    Asthma    exercise induced   Chicken pox     Patient Active Problem List   Diagnosis Date Noted   Food allergy 04/26/2022   Rash 04/25/2022   IUD (intrauterine device) in place 04/24/2022   Cyst, dermoid, scalp and neck 03/31/2022   Mass of right breast 02/01/2022   Anxiety 05/08/2017   Family history of breast cancer in female 03/05/2017   Rhinitis, allergic 11/30/2014   Psoriasis 11/30/2014   Family history of hypothyroidism 07/31/2014   Allergic rhinitis 03/12/2013   Physical exam, annual 06/19/2012    Past Surgical History:  Procedure Laterality Date   FRACTURE SURGERY     TONSILLECTOMY     TONSILLECTOMY AND ADENOIDECTOMY  1997    OB History     Gravida  0   Para  0   Term  0   Preterm  0   AB  0   Living  0      SAB  0   IAB  0   Ectopic  0   Multiple  0   Live Births               Home Medications    Prior to Admission medications   Medication Sig Start Date End Date Taking? Authorizing Provider  EPINEPHrine 0.3 mg/0.3 mL IJ SOAJ injection Inject 0.3 mg into the muscle as needed for anaphylaxis. 07/23/22  Yes Francene Finders, PA-C  Adapalene 0.3 % gel Use topically daily 03/31/22   Hoyt Koch, MD  albuterol (PROVENTIL HFA;VENTOLIN HFA) 108 (90 BASE) MCG/ACT inhaler Inhale 1-2 puffs into the lungs every 6 (six) hours as needed for wheezing or shortness of breath. 07/26/15   Ivar Drape D, PA  amoxicillin-clavulanate (AUGMENTIN) 875-125 MG tablet Take 1 tablet by mouth 2 (two) times daily. 07/05/22   Binnie Rail, MD  Azelastine HCl 137 MCG/SPRAY SOLN Place 1 spray into both nostrils at bedtime. 07/30/21   [provider]  fluticasone (FLONASE) 50 MCG/ACT nasal spray Place 2 sprays into both nostrils daily. 02/05/17   Hoyt Koch, MD  hydrOXYzine (ATARAX/VISTARIL) 25 MG tablet Take 25 mg by mouth as needed. 03/09/21   [provider]  ibuprofen (ADVIL,MOTRIN) 200 MG tablet Take 200 mg by mouth every 6 (six) hours as needed. Reported on 11/21/2015    [provider]  levocetirizine (XYZAL) 5 MG tablet Take 1 tablet (5 mg total) by mouth at bedtime. 04/27/22   Hoyt Koch, MD  predniSONE (DELTASONE) 20 MG tablet Take 2  tablets (40 mg total) by mouth daily with breakfast. 05/04/22   Hoyt Koch, MD  selenium sulfide (SELSUN) 2.5 % shampoo Apply 1 Application topically daily as needed for irritation. 04/25/22   Hoyt Koch, MD  traZODone (DESYREL) 50 MG tablet TAKE 1 TO 2 TABLETS(50 TO 100 MG) BY MOUTH AT BEDTIME AS NEEDED FOR SLEEP 10/04/21   Hoyt Koch, MD  triamcinolone cream (KENALOG) 0.1 % Apply 1 Application topically 2 (two) times daily. 04/25/22   Hoyt Koch, MD  valACYclovir (VALTREX) 1000 MG tablet Take 1 tablet by mouth 2 times daily. 02/13/22   Hoyt Koch, MD    Family History Family History  Problem Relation Age of Onset   Hyperlipidemia Mother    Polycystic ovary syndrome Mother    Breast cancer Maternal Grandmother    Cancer Maternal Grandmother        breast   Breast cancer Paternal  Grandmother    Arthritis Paternal Grandfather    Cancer Paternal Grandfather        breast   Heart disease Father    Sleep apnea Father    Liver disease Father    Sleep apnea Brother    Breast cancer Maternal Aunt     Social History Social History   Tobacco Use   Smoking status: Never   Smokeless tobacco: Never  Substance Use Topics   Alcohol use: Yes    Alcohol/week: 0.0 standard drinks of alcohol    Comment: A COUPLE DRINKS NIGHTLY    Drug use: No     Allergies   Shellfish allergy, Pseudoephedrine hcl er, and Soy allergy   Review of Systems Review of Systems  Constitutional:  Negative for chills and fever.  HENT:  Positive for facial swelling. Negative for trouble swallowing.   Eyes:  Negative for discharge and redness.  Respiratory:  Negative for shortness of breath and wheezing.      Physical Exam Triage Vital Signs ED Triage Vitals  Enc Vitals Group     BP      Pulse      Resp      Temp      Temp src      SpO2      Weight      Height      Head Circumference      Peak Flow      Pain Score      Pain Loc      Pain Edu?      Excl. in [place]?    No data found.  Updated Vital Signs BP 129/82   Pulse 85   Temp 97.9 F (36.6 C)   Resp 20   SpO2 100%     Physical Exam Vitals and nursing note reviewed.  Constitutional:      General: She is not in acute distress.    Appearance: Normal appearance. She is not ill-appearing.  HENT:     Head: Normocephalic and atraumatic.     Comments: Minimal facial swelling, erythema noted, no swelling to lips or tongue    Nose: Nose normal. No congestion or rhinorrhea.     Mouth/Throat:     Mouth: Mucous membranes are moist.     Pharynx: Oropharynx is clear. No oropharyngeal exudate or posterior oropharyngeal erythema.  Eyes:     Conjunctiva/sclera: Conjunctivae normal.  Cardiovascular:     Rate and Rhythm: Normal rate and regular rhythm.     Heart sounds: Normal heart sounds. No murmur  heard. Pulmonary:      Effort: Pulmonary effort is normal. No respiratory distress.     Breath sounds: Normal breath sounds. No stridor. No wheezing, rhonchi or rales.  Neurological:     Mental Status: She is alert.  Psychiatric:        Mood and Affect: Mood normal.        Behavior: Behavior normal.        Thought Content: Thought content normal.      UC Treatments / Results  Labs (all labs ordered are listed, but only abnormal results are displayed) Labs Reviewed - No data to display  EKG   Radiology No results found.  Procedures Procedures (including critical care time)  Medications Ordered in UC Medications  methylPREDNISolone sodium succinate (SOLU-MEDROL) 125 mg/2 mL injection 80 mg (80 mg Intramuscular Given 07/23/22 1714)    Initial Impression / Assessment and Plan / UC Course  I have reviewed the triage vital signs and the nursing notes.  Pertinent labs & imaging results that were available during my care of the patient were reviewed by me and considered in my medical decision making (see chart for details).    Suspect mild allergic reaction- no apparent anaphylaxis. Will treat with steroid injection in office and epipen refilled. Recommended she call 911 with any worsening symptoms or report to ED.   Final Clinical Impressions(s) / UC Diagnoses   Final diagnoses:  Allergic reaction, initial encounter   Discharge Instructions   None    ED Prescriptions     Medication Sig Dispense Auth. Provider   EPINEPHrine 0.3 mg/0.3 mL IJ SOAJ injection Inject 0.3 mg into the muscle as needed for anaphylaxis. 1 each Francene Finders, PA-C      PDMP not reviewed this encounter.   Francene Finders, PA-C 07/23/22 1754

## 2022-08-01 ENCOUNTER — Telehealth: Payer: BC Managed Care – PPO | Admitting: Internal Medicine

## 2022-08-01 DIAGNOSIS — F4310 Post-traumatic stress disorder, unspecified: Secondary | ICD-10-CM | POA: Diagnosis not present

## 2022-08-11 ENCOUNTER — Encounter: Payer: Self-pay | Admitting: Internal Medicine

## 2022-08-11 ENCOUNTER — Telehealth (INDEPENDENT_AMBULATORY_CARE_PROVIDER_SITE_OTHER): Payer: BC Managed Care – PPO | Admitting: Internal Medicine

## 2022-08-11 DIAGNOSIS — Z91018 Allergy to other foods: Secondary | ICD-10-CM | POA: Diagnosis not present

## 2022-08-11 MED ORDER — ACYCLOVIR 5 % EX OINT: 1.0000 | TOPICAL_OINTMENT | CUTANEOUS | 11 refills | Status: DC

## 2022-08-11 NOTE — Assessment & Plan Note (Signed)
She has had several allergic reactions recently to foods likely. Needs allergy referral for food triggers. Several episodes after salad. Currently resolved. Has epi pen.

## 2022-08-11 NOTE — Progress Notes (Signed)
Virtual Visit via Video Note  I connected with Ashley Bond on 08/11/22 at  4:00 PM EDT by a video enabled telemedicine application and verified that I am speaking with the correct person using two identifiers.  The patient and the provider were at separate locations throughout the entire encounter. Patient location: home, Provider location: work   I discussed the limitations of evaluation and management by telemedicine and the availability of in person appointments. The patient expressed understanding and agreed to proceed. The patient and the provider were the only parties present for the visit unless noted in HPI below.  History of Present Illness: The patient is a 31 y.o. female with visit for allergic reaction to food. After a salad.  Observations/Objective: Appearance: normal, breathing appears normal, throat normal, mental status is A and O times 3  Assessment and Plan: See problem oriented charting  Follow Up Instructions: referral to allergy  I discussed the assessment and treatment plan with the patient. The patient was provided an opportunity to ask questions and all were answered. The patient agreed with the plan and demonstrated an understanding of the instructions.   The patient was advised to call back or seek an in-person evaluation if the symptoms worsen or if the condition fails to improve as anticipated.  Hoyt Koch, MD

## 2022-08-18 DIAGNOSIS — F4310 Post-traumatic stress disorder, unspecified: Secondary | ICD-10-CM | POA: Diagnosis not present

## 2022-08-29 DIAGNOSIS — F4310 Post-traumatic stress disorder, unspecified: Secondary | ICD-10-CM | POA: Diagnosis not present

## 2022-10-05 DIAGNOSIS — F4310 Post-traumatic stress disorder, unspecified: Secondary | ICD-10-CM | POA: Diagnosis not present

## 2022-10-19 DIAGNOSIS — F4310 Post-traumatic stress disorder, unspecified: Secondary | ICD-10-CM | POA: Diagnosis not present

## 2022-10-20 ENCOUNTER — Other Ambulatory Visit: Payer: Self-pay

## 2022-10-20 ENCOUNTER — Ambulatory Visit: Payer: BC Managed Care – PPO | Admitting: Internal Medicine

## 2022-10-20 ENCOUNTER — Encounter: Payer: Self-pay | Admitting: Internal Medicine

## 2022-10-20 VITALS — BP 130/78 | HR 82 | Temp 99.2°F | Resp 16 | Ht 66.0 in | Wt 177.0 lb

## 2022-10-20 DIAGNOSIS — J3089 Other allergic rhinitis: Secondary | ICD-10-CM

## 2022-10-20 DIAGNOSIS — H1045 Other chronic allergic conjunctivitis: Secondary | ICD-10-CM

## 2022-10-20 DIAGNOSIS — T7800XA Anaphylactic reaction due to unspecified food, initial encounter: Secondary | ICD-10-CM

## 2022-10-20 DIAGNOSIS — J302 Other seasonal allergic rhinitis: Secondary | ICD-10-CM

## 2022-10-20 NOTE — Patient Instructions (Addendum)
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  Also note any exercise, NSAIDs, alcohol, menstrual cycle as these can affect reactions  Treatment: Continue Xyzal 1 tab by mouth daily Continue Flonase 2 sprays in each nostril daily Continue Astelin 1 spray in both nostrils nightly  Follow up: 8 weeks   Thank you so much for letting me partake in your care today.  Don't hesitate to reach out if you have any additional concerns!  Roney Marion, MD  [facility] [place], [place]

## 2022-10-20 NOTE — Progress Notes (Signed)
New Patient Note  RE: Ashley Bond MRN: [id number] DOB: 1990-11-13 Date of Office Visit: 10/20/2022  Consult requested by: Hoyt Koch, * Primary care provider: Hoyt Koch, MD  Chief Complaint: Allergy Testing (Food allergies - Radicchio cabbage in salad mix may be the cause of horrible reaction that sent her to Urgent Care - was given steroid shot )  History of Present Illness: I had the pleasure of seeing Ashley Bond for initial evaluation at the [facility] on 10/20/2022. She is a 32 y.o. female, who is referred here by Hoyt Koch, MD for the evaluation of anaphylaxis.  History obtained from patient, chart review.  Concern for Food Allergy:  Food of concern: salads, grains  History of reaction: after eating salads she will develop eye swelling which happened twice within 15 minutes of eating.  She was seen by PCP and had sIgE testing.  The 3rd occurrence after eating a salad and going for a run.  She developed, nausea, dry heaving, reflux, diarrhea, diffuse urticaria, facial angioedema., wheezing.  She presented UC and was treated with steroid injection and did well.   Previous allergy testing yes: PCP ordered specific IgE testing which is very low positive (0.11-0.67) to peanut, walnut, hazelnut, almonds Eats egg, dairy, wheat, soy, fish, shellfish, peanuts, tree nuts, sesame without reactions Carries an epinephrine autoinjector: yes Has food allergy action plan no  Chronic rhinitis: started as a young child Symptoms include: nasal congestion, rhinorrhea, post nasal drainage, sneezing, watery eyes, itchy eyes, and itchy nose  Occurs year-round with seasonal flares Potential triggers: Pollen season, cats, dogs Treatments tried: Flonase, Astelin, Xyzal Previous allergy testing:  Trees and dust mite - 2011 History of reflux/heartburn: no History of chronic sinusitis or sinus surgery: no Nonallergic triggers:  Denies  Doxycycline causes urticaria   Assessment and Plan: Ashley Bond is a 32 y.o. female with: Food-dependent exercise-induced anaphylaxis - Plan: Allergy Test  Seasonal and perennial allergic rhinitis - Plan: Allergy Test  Other chronic allergic conjunctivitis of both eyes - Plan: Allergy Test Plan: Patient Instructions  Environmental allergy testing positive to grass pollen, weed pollen, tree pollen, mold, dust mite, cat, dog  Food allergy testing positive to green pea, avocado, onion, coconut; borderline to rye, hops, rice, [place]  Based on symptoms and was most suspicious for food dependent exercise-induced anaphylaxis  Avoid tree nuts, green pea, avocado, onion, coconut and exercise within 2 hours before and after consumption  Okay to eat on their own if you are not exercising  Keep symptom diary for any additional reactions and note any food, medication, contact exposure 2 hours before symptoms.  Also note any exercise, NSAIDs, alcohol, menstrual cycle as these can affect reactions  Treatment: Continue Xyzal 1 tab by mouth daily Continue Flonase 2 sprays in each nostril daily Continue Astelin 1 spray in both nostrils nightly  Follow up: 8 weeks   Thank you so much for letting me partake in your care today.  Don't hesitate to reach out if you have any additional concerns!  Roney Marion, MD  [facility] [place] [place]   No orders of the defined types were placed in this encounter.  Lab Orders  No laboratory test(s) ordered today    Other allergy screening: Asthma: yes exercise-induced, has not needed albuterol in over a year Rhino conjunctivitis: yes Food allergy: yes Medication allergy: yes Hymenoptera allergy: no Urticaria: no Eczema:no History of recurrent infections suggestive of immunodeficency: no  Diagnostics:  Skin Testing: Environmental allergy panel and select foods. Environmental allergy testing positive to grass pollen, weed  pollen, tree pollen, mold, dust mite, cat, dog  Food allergy testing positive to green pea, avocado, onion, coconut; borderline to rye, hops, rice, [place] Results interpreted by myself and discussed with patient/family.  Airborne Adult Perc - 10/20/22 1613     Time Antigen Placed 1558    Allergen Manufacturer Lavella Hammock    Location Back    Number of Test 58    1. Control-Buffer 50% Glycerol Negative    2. Control-Histamine 1 mg/ml 4+    3. Albumin saline Negative    4. [place] 3+    5. [place] 3+    6. Johnson 3+    7. [place] Blue 3+    9. Perennial Rye Negative    10. Sweet Vernal 4+    11. Timothy 4+    12. Cocklebur 3+    13. Burweed Marshelder Negative    14. Ragweed, short 4+    15. Ragweed, Giant 4+    16. Plantain,  English Negative    17. Lamb's Quarters Negative    18. Sheep Sorrell Negative    19. Rough Pigweed Negative    20. Marsh Elder, Rough Negative    21. Mugwort, Common Negative    22. Ash mix 2+    23. Birch mix 2+    24. Beech American 2+    25. Box, Elder Negative    26. Cedar, red 4+    27. Cottonwood, [place] Negative    28. Elm mix Negative    29. Hickory Negative    30. Maple mix Negative    31. Oak, [place] mix Negative    32. Pecan Pollen 3+    33. Pine mix 3+    34. Sycamore Eastern 3+    35. Westbrook, Black Pollen 4+    36. Alternaria alternata Negative    37. Cladosporium Herbarum Negative    38. Aspergillus mix Negative    39. Penicillium mix Negative    40. Bipolaris sorokiniana (Helminthosporium) Negative    41. Drechslera spicifera (Curvularia) 3+    42. Mucor plumbeus 3+    43. Fusarium moniliforme 3+    44. Aureobasidium pullulans (pullulara) 3+    45. Rhizopus oryzae 3+    46. Botrytis cinera 3+    47. Epicoccum nigrum Negative    48. Phoma betae 2+    49. Candida Albicans Negative    50. Trichophyton mentagrophytes Negative    51. Mite, D Farinae  5,000 AU/ml 4+    52. Mite, D Pteronyssinus  5,000 AU/ml 4+    53. Cat Hair  10,000 BAU/ml 4+    54.  Dog Epithelia 3+    55. Mixed Feathers 2+    56. Horse Epithelia Negative    57. Cockroach, German Negative    58. Mouse Negative    59. Tobacco Leaf Negative             Food Adult Perc - 10/20/22 1600     Time Antigen Placed 1558    Allergen Manufacturer Lavella Hammock    Location Back    8. Shellfish Mix Negative    9. Fish Mix Negative    16. Coconut 3+    30. Barley Negative    31. Oat  Negative    32. Rye  2+    33. Hops 2+    34. Rice 2+    37. Pork Negative  38. [place] Meat 2+    39. Chicken Meat Negative    40. Beef Negative    42. Tomato Negative    45. Pea, Green/English 4+    47. Mushrooms Negative    48. Avocado 4+    49. Onion 4+    50. Cabbage Negative    51. Carrots Negative    58. Apple Negative    60. Strawberry Negative    69. Ginger Negative    70. Garlic Negative    71. Pepper, black Negative    72. Mustard Negative             Past Medical History: Patient Active Problem List   Diagnosis Date Noted   Food allergy 04/26/2022   Rash 04/25/2022   IUD (intrauterine device) in place 04/24/2022   Cyst, dermoid, scalp and neck 03/31/2022   Mass of right breast 02/01/2022   Anxiety 05/08/2017   Family history of breast cancer in female 03/05/2017   Rhinitis, allergic 11/30/2014   Psoriasis 11/30/2014   Family history of hypothyroidism 07/31/2014   Allergic rhinitis 03/12/2013   Physical exam, annual 06/19/2012   Past Medical History:  Diagnosis Date   Allergy    Anxiety    Asthma    exercise induced   Chicken pox    Past Surgical History: Past Surgical History:  Procedure Laterality Date   [place]   Medication List:  Current Outpatient Medications  Medication Sig Dispense Refill   Adapalene 0.3 % gel Use topically daily 45 g 0   albuterol (PROVENTIL HFA;VENTOLIN HFA) 108 (90 BASE) MCG/ACT inhaler Inhale 1-2 puffs into the lungs every  6 (six) hours as needed for wheezing or shortness of breath. 6.7 g 2   amoxicillin-clavulanate (AUGMENTIN) 875-125 MG tablet Take 1 tablet by mouth 2 (two) times daily. 20 tablet 0   Azelastine HCl 137 MCG/SPRAY SOLN Place 1 spray into both nostrils at bedtime.     EPINEPHrine 0.3 mg/0.3 mL IJ SOAJ injection Inject 0.3 mg into the muscle as needed for anaphylaxis. 1 each 0   fluticasone (FLONASE) 50 MCG/ACT nasal spray Place 2 sprays into both nostrils daily. 16 g 11   hydrOXYzine (ATARAX/VISTARIL) 25 MG tablet Take 25 mg by mouth as needed.     ibuprofen (ADVIL,MOTRIN) 200 MG tablet Take 200 mg by mouth every 6 (six) hours as needed. Reported on 11/21/2015     levocetirizine (XYZAL) 5 MG tablet Take 1 tablet (5 mg total) by mouth at bedtime. 90 tablet 3   selenium sulfide (SELSUN) 2.5 % shampoo Apply 1 Application topically daily as needed for irritation. 118 mL 12   traZODone (DESYREL) 50 MG tablet TAKE 1 TO 2 TABLETS(50 TO 100 MG) BY MOUTH AT BEDTIME AS NEEDED FOR SLEEP 60 tablet 11   triamcinolone cream (KENALOG) 0.1 % Apply 1 Application topically 2 (two) times daily. 100 g 0   valACYclovir (VALTREX) 1000 MG tablet Take 1 tablet by mouth 2 times daily. 180 tablet 0   acyclovir ointment (ZOVIRAX) 5 % Apply 1 Application topically every 3 (three) hours. (Patient not taking: Reported on 10/20/2022) 30 g 11   No current facility-administered medications for this visit.   Allergies: Allergies  Allergen Reactions   Shellfish Allergy    Soy Allergy    Social History: Social History   Socioeconomic History   Marital status: Married    Spouse name: Not on  file   Number of children: Not on file   Years of education: Not on file   Highest education level: Not on file  Occupational History   Not on file  Tobacco Use   Smoking status: Never   Smokeless tobacco: Never  Substance and Sexual Activity   Alcohol use: Yes    Alcohol/week: 0.0 standard drinks of alcohol    Comment: A COUPLE  DRINKS NIGHTLY    Drug use: No   Sexual activity: Yes    Birth control/protection: Inserts  Other Topics Concern   Not on file  Social History Narrative   Not on file   Social Determinants of Health   Financial Resource Strain: Not on file  Food Insecurity: Not on file  Transportation Needs: Not on file  Physical Activity: Not on file  Stress: Not on file  Social Connections: Not on file   Lives in a single-family home built in the [place].  There are no roaches in the house and bed is 2 feet off the floor.  There are no dust mite precautions on better pillows.  She is not exposed to fumes, chemicals or dust.  There is no HEPA filter in the home and home is not near an interstate industrial area. 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 HistoryFreight forwarder in the house: no Charity fundraiser in the family room: no Carpet in the bedroom: no Heating: electric Cooling: central Pet: yes dogs with access to bedroom  Family History: Family History  Problem Relation Age of Onset   Hyperlipidemia Mother    Polycystic ovary syndrome Mother    Breast cancer Maternal Grandmother    Cancer Maternal Grandmother        breast   Breast cancer Paternal Grandmother    Arthritis Paternal Grandfather    Cancer Paternal Grandfather        breast   Heart disease Father    Sleep apnea Father    Liver disease Father    Sleep apnea Brother    Breast cancer Maternal Aunt      ROS: All others negative except as noted per HPI.   Objective: BP 130/78   Pulse 82   Temp 99.2 F (37.3 C)   Resp 16   Ht '5\' 6"'$  (1.676 m)   Wt 177 lb (80.3 kg)   SpO2 99%   BMI 28.57 kg/m  Body mass index is 28.57 kg/m.  General Appearance:  Alert, cooperative, no distress, appears stated age  Head:  Normocephalic, without obvious abnormality, atraumatic  Eyes:  Conjunctiva clear, EOM's intact  Nose: Nares normal,  erythematous nasal mucosa, hypertrophic turbinates, no visible  anterior polyps, and septum midline  Throat: Lips, tongue normal; teeth and gums normal, normal posterior oropharynx  Neck: Supple, symmetrical  Lungs:   clear to auscultation bilaterally, Respirations unlabored, no coughing  Heart:  regular rate and rhythm and no murmur, Appears well perfused  Extremities: No edema  Skin: Skin color, texture, turgor normal, no rashes or lesions on visualized portions of skin  Neurologic: No gross deficits   The plan was reviewed with the patient/family, and all questions/concerned were addressed.  It was my pleasure to see Ashley Bond today and participate in her care. Please feel free to contact me with any questions or concerns.  Sincerely,  Roney Marion, MD Allergy & Immunology  [facility] office: [phone] [facility] office: [phone]

## 2022-11-02 DIAGNOSIS — F4310 Post-traumatic stress disorder, unspecified: Secondary | ICD-10-CM | POA: Diagnosis not present

## 2022-11-09 DIAGNOSIS — F411 Generalized anxiety disorder: Secondary | ICD-10-CM | POA: Diagnosis not present

## 2022-12-11 DIAGNOSIS — F4310 Post-traumatic stress disorder, unspecified: Secondary | ICD-10-CM | POA: Diagnosis not present

## 2022-12-22 ENCOUNTER — Ambulatory Visit: Payer: BC Managed Care – PPO | Admitting: Internal Medicine

## 2022-12-26 ENCOUNTER — Ambulatory Visit: Payer: BC Managed Care – PPO | Admitting: Internal Medicine

## 2023-01-01 DIAGNOSIS — F4310 Post-traumatic stress disorder, unspecified: Secondary | ICD-10-CM | POA: Diagnosis not present

## 2023-01-19 ENCOUNTER — Ambulatory Visit (INDEPENDENT_AMBULATORY_CARE_PROVIDER_SITE_OTHER): Payer: BC Managed Care – PPO

## 2023-01-19 ENCOUNTER — Encounter: Payer: Self-pay | Admitting: Internal Medicine

## 2023-01-19 ENCOUNTER — Ambulatory Visit (INDEPENDENT_AMBULATORY_CARE_PROVIDER_SITE_OTHER): Payer: BC Managed Care – PPO | Admitting: Internal Medicine

## 2023-01-19 VITALS — BP 124/80 | HR 89 | Temp 98.4°F | Ht 66.0 in | Wt 182.0 lb

## 2023-01-19 DIAGNOSIS — R200 Anesthesia of skin: Secondary | ICD-10-CM

## 2023-01-19 DIAGNOSIS — M542 Cervicalgia: Secondary | ICD-10-CM | POA: Diagnosis not present

## 2023-01-19 DIAGNOSIS — Z0000 Encounter for general adult medical examination without abnormal findings: Secondary | ICD-10-CM | POA: Diagnosis not present

## 2023-01-19 DIAGNOSIS — Z8349 Family history of other endocrine, nutritional and metabolic diseases: Secondary | ICD-10-CM | POA: Diagnosis not present

## 2023-01-19 DIAGNOSIS — R531 Weakness: Secondary | ICD-10-CM

## 2023-01-19 DIAGNOSIS — R5383 Other fatigue: Secondary | ICD-10-CM | POA: Insufficient documentation

## 2023-01-19 LAB — COMPREHENSIVE METABOLIC PANEL
ALT: 12 U/L (ref 0–35)
AST: 17 U/L (ref 0–37)
Albumin: 5 g/dL (ref 3.5–5.2)
Alkaline Phosphatase: 56 U/L (ref 39–117)
BUN: 11 mg/dL (ref 6–23)
CO2: 29 mEq/L (ref 19–32)
Calcium: 9.7 mg/dL (ref 8.4–10.5)
Chloride: 103 mEq/L (ref 96–112)
Creatinine, Ser: 0.76 mg/dL (ref 0.40–1.20)
GFR: 102.37 mL/min (ref >60.00)
Glucose, Bld: 101 mg/dL — ABNORMAL HIGH (ref 70–99)
Potassium: 4.1 mEq/L (ref 3.5–5.1)
Sodium: 139 mEq/L (ref 135–145)
Total Bilirubin: 0.3 mg/dL (ref 0.2–1.2)
Total Protein: 7.4 g/dL (ref 6.0–8.3)

## 2023-01-19 LAB — LIPID PANEL
Cholesterol: 186 mg/dL (ref 0–200)
HDL: 102.8 mg/dL (ref >39.00)
LDL Cholesterol: 76 mg/dL (ref 0–99)
NonHDL: 83.12
Total CHOL/HDL Ratio: 2
Triglycerides: 37 mg/dL (ref 0.0–149.0)
VLDL: 7.4 mg/dL (ref 0.0–40.0)

## 2023-01-19 LAB — CBC
HCT: 38.7 % (ref 36.0–46.0)
Hemoglobin: 13 g/dL (ref 12.0–15.0)
MCHC: 33.6 g/dL (ref 30.0–36.0)
MCV: 91.1 fl (ref 78.0–100.0)
Platelets: 324 10*3/uL (ref 150.0–400.0)
RBC: 4.24 Mil/uL (ref 3.87–5.11)
RDW: 13 % (ref 11.5–15.5)
WBC: 7.4 10*3/uL (ref 4.0–10.5)

## 2023-01-19 LAB — TSH: TSH: 0.66 u[IU]/mL (ref 0.35–5.50)

## 2023-01-19 LAB — VITAMIN B12: Vitamin B-12: 340 pg/mL (ref 211–911)

## 2023-01-19 LAB — VITAMIN D 25 HYDROXY (VIT D DEFICIENCY, FRACTURES): VITD: 43.84 ng/mL (ref 30.00–100.00)

## 2023-01-19 MED ORDER — SELENIUM SULFIDE 2.5 % EX LOTN
1.0000 | TOPICAL_LOTION | Freq: Every day | CUTANEOUS | 12 refills | Status: DC | PRN
Start: 2023-01-19 — End: 2023-05-29

## 2023-01-19 NOTE — Patient Instructions (Signed)
We will do the MRI of the brain and the x-ray of the neck. We will check the labs today.

## 2023-01-19 NOTE — Assessment & Plan Note (Signed)
Flu shot yearly. Tetanus up to date. Pap smear up to date. Counseled about sun safety and mole surveillance. Counseled about the dangers of distracted driving. Given 10 year screening recommendations.

## 2023-01-19 NOTE — Assessment & Plan Note (Signed)
She is having some weakness, blurred vision at times, and fatigue. Concern for MS as she already has an autoimmune disease. Ordered MRI brain.

## 2023-01-19 NOTE — Assessment & Plan Note (Signed)
Checking vitamin D and B12.

## 2023-01-19 NOTE — Assessment & Plan Note (Signed)
Checking TSH and adjust as needed.  

## 2023-01-19 NOTE — Progress Notes (Signed)
   Subjective:   Patient ID: Ashley Bond, female    DOB: 05/31/1991, 32 y.o.   MRN: [id number]  HPI The patient is here for physical. With concerns.   PMH, [facility], social history reviewed and updated  Review of Systems  Constitutional:  Positive for activity change.  HENT: Negative.    Eyes: Negative.   Respiratory:  Negative for cough, chest tightness and shortness of breath.   Cardiovascular:  Negative for chest pain, palpitations and leg swelling.  Gastrointestinal:  Negative for abdominal distention, abdominal pain, constipation, diarrhea, nausea and vomiting.  Musculoskeletal:  Positive for arthralgias and myalgias.  Skin: Negative.   Neurological:  Positive for weakness and numbness.  Psychiatric/Behavioral: Negative.      Objective:  Physical Exam Constitutional:      Appearance: She is well-developed.  HENT:     Head: Normocephalic and atraumatic.  Cardiovascular:     Rate and Rhythm: Normal rate and regular rhythm.  Pulmonary:     Effort: Pulmonary effort is normal. No respiratory distress.     Breath sounds: Normal breath sounds. No wheezing or rales.  Abdominal:     General: Bowel sounds are normal. There is no distension.     Palpations: Abdomen is soft.     Tenderness: There is no abdominal tenderness. There is no rebound.  Musculoskeletal:     Cervical back: Normal range of motion.  Skin:    General: Skin is warm and dry.  Neurological:     Mental Status: She is alert and oriented to person, place, and time.     Coordination: Coordination normal.     Vitals:   01/19/23 1505  BP: 124/80  Pulse: 89  Temp: 98.4 F (36.9 C)  TempSrc: Oral  SpO2: 98%  Weight: 182 lb (82.6 kg)  Height: 5\' 6"  (1.676 m)    Assessment & Plan:

## 2023-01-19 NOTE — Assessment & Plan Note (Signed)
Numbness in the base of the skull and neck. Ordered x-ray neck and TSH and B12 and vitamin D and CBC. Treat as appropriate.

## 2023-01-22 DIAGNOSIS — F4310 Post-traumatic stress disorder, unspecified: Secondary | ICD-10-CM | POA: Diagnosis not present

## 2023-03-22 DIAGNOSIS — F4310 Post-traumatic stress disorder, unspecified: Secondary | ICD-10-CM | POA: Diagnosis not present

## 2023-03-30 DIAGNOSIS — F4312 Post-traumatic stress disorder, chronic: Secondary | ICD-10-CM | POA: Diagnosis not present

## 2023-03-30 DIAGNOSIS — Z79899 Other long term (current) drug therapy: Secondary | ICD-10-CM | POA: Diagnosis not present

## 2023-03-30 DIAGNOSIS — F902 Attention-deficit hyperactivity disorder, combined type: Secondary | ICD-10-CM | POA: Diagnosis not present

## 2023-03-30 DIAGNOSIS — F419 Anxiety disorder, unspecified: Secondary | ICD-10-CM | POA: Diagnosis not present

## 2023-04-12 DIAGNOSIS — F4310 Post-traumatic stress disorder, unspecified: Secondary | ICD-10-CM | POA: Diagnosis not present

## 2023-05-03 DIAGNOSIS — F4310 Post-traumatic stress disorder, unspecified: Secondary | ICD-10-CM | POA: Diagnosis not present

## 2023-05-16 ENCOUNTER — Encounter (INDEPENDENT_AMBULATORY_CARE_PROVIDER_SITE_OTHER): Payer: Self-pay

## 2023-05-16 DIAGNOSIS — Z79899 Other long term (current) drug therapy: Secondary | ICD-10-CM | POA: Diagnosis not present

## 2023-05-16 DIAGNOSIS — F902 Attention-deficit hyperactivity disorder, combined type: Secondary | ICD-10-CM | POA: Diagnosis not present

## 2023-05-17 DIAGNOSIS — F4310 Post-traumatic stress disorder, unspecified: Secondary | ICD-10-CM | POA: Diagnosis not present

## 2023-05-17 DIAGNOSIS — F902 Attention-deficit hyperactivity disorder, combined type: Secondary | ICD-10-CM | POA: Diagnosis not present

## 2023-05-29 ENCOUNTER — Other Ambulatory Visit: Payer: Self-pay | Admitting: Internal Medicine

## 2023-05-29 ENCOUNTER — Encounter: Payer: Self-pay | Admitting: Internal Medicine

## 2023-05-29 ENCOUNTER — Ambulatory Visit: Payer: BC Managed Care – PPO | Admitting: Internal Medicine

## 2023-05-29 VITALS — BP 124/80 | HR 69 | Temp 98.1°F | Ht 66.0 in | Wt 184.0 lb

## 2023-05-29 DIAGNOSIS — R21 Rash and other nonspecific skin eruption: Secondary | ICD-10-CM | POA: Diagnosis not present

## 2023-05-29 DIAGNOSIS — M25511 Pain in right shoulder: Secondary | ICD-10-CM | POA: Diagnosis not present

## 2023-05-29 DIAGNOSIS — B079 Viral wart, unspecified: Secondary | ICD-10-CM | POA: Diagnosis not present

## 2023-05-29 DIAGNOSIS — L709 Acne, unspecified: Secondary | ICD-10-CM | POA: Diagnosis not present

## 2023-05-29 DIAGNOSIS — G8929 Other chronic pain: Secondary | ICD-10-CM

## 2023-05-29 MED ORDER — SELENIUM SULFIDE 2.5 % EX LOTN: 1.0000 | TOPICAL_LOTION | Freq: Every day | CUTANEOUS | 12 refills | Status: DC | PRN

## 2023-05-29 MED ORDER — CLINDAMYCIN PHOSPHATE 1 % EX GEL: Freq: Two times a day (BID) | CUTANEOUS | 0 refills | Status: AC

## 2023-05-29 NOTE — Patient Instructions (Signed)
We will send in the acne cream

## 2023-05-29 NOTE — Assessment & Plan Note (Signed)
Referral to derm for treatment as she wishes to get treated

## 2023-05-29 NOTE — Assessment & Plan Note (Signed)
Acne on face clindamycin gel done to use. Referral to dermatology.

## 2023-05-29 NOTE — Assessment & Plan Note (Signed)
Suspect muscle strain/tear right scapular region. Rotator cuff not painful and no limitation ROM. Advised to wait another few weeks or month and if not improving or worsening refer to sports medicine.

## 2023-05-29 NOTE — Progress Notes (Signed)
   Subjective:   Patient ID: Ashley Bond, female    DOB: 06-Apr-1991, 32 y.o.   MRN: [id number]  HPI Right shoulder a few month was doing squatting has avoided that motion and wart on hand and acne  Review of Systems  Constitutional: Negative.   HENT: Negative.    Eyes: Negative.   Respiratory:  Negative for cough, chest tightness and shortness of breath.   Cardiovascular:  Negative for chest pain, palpitations and leg swelling.  Gastrointestinal:  Negative for abdominal distention, abdominal pain, constipation, diarrhea, nausea and vomiting.  Musculoskeletal:  Positive for arthralgias and myalgias.  Skin:  Positive for rash.  Neurological: Negative.   Psychiatric/Behavioral: Negative.      Objective:  Physical Exam Constitutional:      Appearance: She is well-developed.  HENT:     Head: Normocephalic and atraumatic.     Ears:     Comments: Acne without scarring and without abscess on the face Cardiovascular:     Rate and Rhythm: Normal rate and regular rhythm.  Pulmonary:     Effort: Pulmonary effort is normal. No respiratory distress.     Breath sounds: Normal breath sounds. No wheezing or rales.  Abdominal:     General: Bowel sounds are normal. There is no distension.     Palpations: Abdomen is soft.     Tenderness: There is no abdominal tenderness. There is no rebound.  Musculoskeletal:        General: Tenderness present.     Cervical back: Normal range of motion.     Comments: Right scapular region pain to palpation  Skin:    General: Skin is warm and dry.  Neurological:     Mental Status: She is alert and oriented to person, place, and time.     Coordination: Coordination normal.     Vitals:   05/29/23 1545  BP: 124/80  Pulse: 69  Temp: 98.1 F (36.7 C)  TempSrc: Oral  SpO2: 97%  Weight: 184 lb (83.5 kg)  Height: 5\' 6"  (1.676 m)    Assessment & Plan:

## 2023-05-30 DIAGNOSIS — F4310 Post-traumatic stress disorder, unspecified: Secondary | ICD-10-CM | POA: Diagnosis not present

## 2023-06-03 ENCOUNTER — Encounter: Payer: Self-pay | Admitting: Internal Medicine

## 2023-06-04 ENCOUNTER — Other Ambulatory Visit: Payer: Self-pay

## 2023-06-04 MED ORDER — ADAPALENE 0.3 % EX GEL: CUTANEOUS | 0 refills | Status: DC

## 2023-06-19 ENCOUNTER — Other Ambulatory Visit: Payer: Self-pay | Admitting: Medical Genetics

## 2023-06-19 DIAGNOSIS — Z006 Encounter for examination for normal comparison and control in clinical research program: Secondary | ICD-10-CM

## 2023-07-03 ENCOUNTER — Encounter: Payer: Self-pay | Admitting: Internal Medicine

## 2023-07-03 DIAGNOSIS — M79605 Pain in left leg: Secondary | ICD-10-CM

## 2023-07-04 DIAGNOSIS — F4310 Post-traumatic stress disorder, unspecified: Secondary | ICD-10-CM | POA: Diagnosis not present

## 2023-07-11 ENCOUNTER — Other Ambulatory Visit: Payer: Self-pay

## 2023-07-11 ENCOUNTER — Ambulatory Visit: Payer: BC Managed Care – PPO | Attending: Internal Medicine | Admitting: Physical Therapy

## 2023-07-11 ENCOUNTER — Encounter: Payer: Self-pay | Admitting: Physical Therapy

## 2023-07-11 DIAGNOSIS — M25562 Pain in left knee: Secondary | ICD-10-CM | POA: Insufficient documentation

## 2023-07-11 DIAGNOSIS — M79605 Pain in left leg: Secondary | ICD-10-CM | POA: Insufficient documentation

## 2023-07-11 DIAGNOSIS — M25552 Pain in left hip: Secondary | ICD-10-CM

## 2023-07-11 DIAGNOSIS — G8929 Other chronic pain: Secondary | ICD-10-CM | POA: Diagnosis not present

## 2023-07-11 DIAGNOSIS — M6281 Muscle weakness (generalized): Secondary | ICD-10-CM

## 2023-07-11 NOTE — Therapy (Signed)
OUTPATIENT PHYSICAL THERAPY LOWER EXTREMITY EVALUATION   Patient Name: Ashley Bond MRN: [id number] DOB:[birth year], 32 y.o., female Today's Date: 07/11/2023  END OF SESSION:  PT End of Session - 07/11/23 1335     Visit Number 1    Number of Visits 9    Date for PT Re-Evaluation 09/05/23    PT Start Time 1330    PT Stop Time 1417    PT Time Calculation (min) 47 min             Past Medical History:  Diagnosis Date   Allergy    Anxiety    Asthma    exercise induced   Chicken pox    Chronic pain    PTSD (post-traumatic stress disorder)    Past Surgical History:  Procedure Laterality Date   FRACTURE SURGERY     TONSILLECTOMY     TONSILLECTOMY AND ADENOIDECTOMY  1997   Patient Active Problem List   Diagnosis Date Noted   Wart of hand 05/29/2023   Right shoulder pain 05/29/2023   Other fatigue 01/19/2023   Food allergy 04/26/2022   Rash 04/25/2022   IUD (intrauterine device) in place 04/24/2022   Cyst, dermoid, scalp and neck 03/31/2022   Mass of right breast 02/01/2022   Weakness 11/09/2017   Anxiety 05/08/2017   Family history of breast cancer in female 03/05/2017   Rhinitis, allergic 11/30/2014   Psoriasis 11/30/2014   Family history of hypothyroidism 07/31/2014   Numbness 06/15/2014   Allergic rhinitis 03/12/2013   Physical exam, annual 06/19/2012    PCP: Myrlene Broker, MD   REFERRING PROVIDER: Myrlene Broker, MD   REFERRING DIAG: Left leg pain [M79.605]   THERAPY DIAG:  Chronic pain of left knee  Pain in left hip  Muscle weakness (generalized)  Rationale for Evaluation and Treatment: Rehabilitation  ONSET DATE: 1 month ago  SUBJECTIVE:   SUBJECTIVE STATEMENT: Pt arrives to PT noted L leg pain that started about a month ago. She reports she hasn't been doing a lot of heavy lifting and with her exercises she had added sprints didn't hear or a pop but had a sharp pain right in the middle of the L quad. She said the pain  was very intense and tried icing and taking medication for pain. She attempted to play kickball but noticed she was unable to play. She noted it had been getting  better and 2 weeks ago after playing kickball she didn't do any running and just played out field and that night or the next day noted intense pain which since then the groin had been also aggravated.    PERTINENT HISTORY: Hx of PTSD, Chronic pain,  PAIN:  Are you having pain? Yes: NPRS scale: 4-6/10 quad, and groin Pain location: L quad and adductor  Pain description: aching sore Aggravating factors: walking, standing, moving, stairs, getting in out of a of the car of the couch  Relieving factors: Ice, foam  PRECAUTIONS: None  RED FLAGS: None   WEIGHT BEARING RESTRICTIONS: No  FALLS:  Has patient fallen in last 6 months? 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    OBJECTIVE: *Unless otherwise noted by date, all objective measures were captured at initial evaluation.   DIAGNOSTIC FINDINGS:  N/A  PATIENT  SURVEYS:  FOTO 27% precited 62%  COGNITION: Overall cognitive status: Within functional limits for tasks assessed     SENSATION: WFL  POSTURE: rounded shoulders and forward head  PALPATION: TTP along the mid muscle belly of the rectus femoris/ Sartorious on the L, and soreness along the L adductor longus with mutlple trigger points noted.  Tenderness noted bil along both distal biceps femoris and semi-membranous tendons.  LOWER EXTREMITY ROM:  Active ROM Right eval Left eval  Hip flexion    Hip extension    Hip abduction    Hip adduction    Hip internal rotation    Hip external rotation    Knee flexion    Knee extension    Ankle dorsiflexion    Ankle  plantarflexion    Ankle inversion    Ankle eversion     (Blank rows = not tested)  LOWER EXTREMITY MMT:  MMT Right eval Left eval  Hip flexion 5 4  Hip extension    Hip abduction 5 4 P!  Hip adduction 4+ 4+  Hip internal rotation    Hip external rotation    Knee flexion 5 4+  Knee extension 4+ 4+  Ankle dorsiflexion    Ankle plantarflexion    Ankle inversion    Ankle eversion     (Blank rows = not tested)   GAIT: Distance walked: 110 ft to tx room from waiting area Assistive device utilized: None Level of assistance: Complete Independence Comments: unremarkable.    TODAY'S TREATMENT:                                                                                                                              [facility] Adult PT Treatment:                                                DATE: 07/11/2023 Therapeutic Exercise: Hip flexor stretch thomas test position 2 x 30 stretch Adductor stretch 2 x 30  Navigated 6 inch steps with quad wrap and sitting down with reduction in quad pain.  Provider initial HEp Manual Therapy: MTPR along the rectus femoris x 2, adductor longus x 1 ( showed patient how to perform it.)   Self Care: ACE wrapped around the L thigh to prevent/ reduce pulling of the quad with activation   PATIENT EDUCATION:  Education details: evaluation findings, POC, goals, HEP with proper form/ ratinaleDiscussed importance of avoidance of doing too much at the gym and that its okay to return to doing the exercises at the gym but listen to body and avoid doing excessive weight.  Person educated: Patient Education method: Explanation, Verbal cues, and Handouts Education comprehension: verbalized understanding  HOME EXERCISE PROGRAM: Access Code: [email or web address] URL: [email or web address] Date: 07/11/2023 Prepared by: Lulu Riding  Exercises - Seated Hamstring Stretch  -  1 x daily - 7 x weekly - 2 sets - 2 reps - 30 hold - Supine Hamstring Stretch  with Strap  - 1 x daily - 7 x weekly - 2 sets - 2 reps - 30 hold - Hip Flexor Stretch at Edge of Bed (Mirrored)  - 3 x daily - 7 x weekly - 2 sets - 2 reps - 30 -60 sec hold - Sidelying HIp Circles (BKA)  - 1 x daily - 7 x weekly - 2 sets - 15 reps - Butterfly Groin Stretch  - 7 x weekly - 2 sets - 2- 3 reps - 30-60 sec hold - Side Lunge Adductor Stretch  - 1 x daily - 7 x weekly - 2 sets - 2 - 3 reps - 30-60 sec hold  ASSESSMENT:  CLINICAL IMPRESSION: Patient is a 32 y.o. y.o who was seen today for physical therapy evaluation and treatment for F with dx of L leg pain. She demonstrates weakness secondary to pain with MMT of the L quad and that started over a month ago as a result of a likely strain that occurred after sprinting that resulted in pain, which she re-injured/ exacerbated approximately 2 weeks ago when she went to play kickball. Based on assessment likelihood of a strain is high, she responded well in session to [facility] techniques around the involved area to relieve tension and utilized a quad wrap to reduce pulling with activity which she noted relief of tension with stairs and sitting. Adductor pain likely due to a result of over activity with exercise and compensation as well as the RLE. She would benefit from physical therapy to reduce LLE pain, improve strength, promote efficient posture with lifting and maximize function by addressing the deficits listed.   OBJECTIVE IMPAIRMENTS: decreased activity tolerance, decreased endurance, decreased strength, increased fascial restrictions, increased muscle spasms, improper body mechanics, postural dysfunction, and pain.   ACTIVITY LIMITATIONS: carrying, lifting, standing, squatting, and stairs  PARTICIPATION LIMITATIONS: community activity and exercise  PERSONAL FACTORS: 1 comorbidity: PTSD  are also affecting patient's functional outcome.   REHAB POTENTIAL: Excellent  CLINICAL DECISION MAKING: Stable/uncomplicated  EVALUATION  COMPLEXITY: Low   GOALS: Goals reviewed with patient? Yes  SHORT TERM GOALS: Target date: 08/08/2023  Pt to be IND with initial HEP for therapeutic progression Baseline: Goal status: INITIAL  2.  PT to verbalize/ demo efficient posture and lifting mechanics to prevent and reduce knee  pain.  Baseline:  Goal status: INITIAL  3.  Pt to report pain ti </= 4/10 max to demo improvement in function.  Baseline:  Goal status: INITIAL  LONG TERM GOALS: Target date: 09/05/2023  Increase LLE gross strength to >/= 4+/5 with no pain during testing to demo improvement in condition Baseline:  Goal status: INITIAL  2.  P t to be able to navigate steps, perform ADLs and work related tasks without limitation or issues  Baseline:  Goal status: INITIAL  3.  Pt to return to exercise and weight lifting per her personal goals to maximize QOL Baseline:  Goal status: INITIAL  4.  Improve FOTO score to >/= 62% to demo improvement in function Baseline:  Goal status: INITIAL  5.  Pt to be IND with all HEP and will be able to maintain and progress their current LOF IND. Baseline:  Goal status: INITIAL  PLAN:  PT FREQUENCY: 1-2x/week  PT DURATION: 8 weeks  PLANNED INTERVENTIONS: Therapeutic exercises, Therapeutic activity, Neuromuscular re-education, Balance training, Gait training, Patient/Family education, Self  Care, Joint mobilization, Stair training, Dry Needling, Electrical stimulation, Cryotherapy, Moist heat, Taping, Ultrasound, Ionotophoresis 4mg /ml Dexamethasone, and Manual therapy  PLAN FOR NEXT SESSION: Review/ update HEP PRN. STW around L mid distal quad/ adductor longus strain, How did quad wrap work? Gross LE strengthening focusing on form gradually increasing strength.    Julianny Milstein PT, DPT, LAT, ATC  07/11/23  3:48 PM

## 2023-07-16 DIAGNOSIS — Z79899 Other long term (current) drug therapy: Secondary | ICD-10-CM | POA: Diagnosis not present

## 2023-07-16 DIAGNOSIS — F902 Attention-deficit hyperactivity disorder, combined type: Secondary | ICD-10-CM | POA: Diagnosis not present

## 2023-07-17 DIAGNOSIS — F902 Attention-deficit hyperactivity disorder, combined type: Secondary | ICD-10-CM | POA: Diagnosis not present

## 2023-07-18 NOTE — Therapy (Signed)
OUTPATIENT PHYSICAL THERAPY TREATMENT NOTE   Patient Name: Ashley Bond MRN: [id number] DOB:20-Feb-1991, 32 y.o., female Today's Date: 07/19/2023  END OF SESSION:  PT End of Session - 07/19/23 1616     Visit Number 2    Number of Visits 9    Date for PT Re-Evaluation 09/05/23    PT Start Time 1616    PT Stop Time 1700    PT Time Calculation (min) 44 min    Activity Tolerance Patient tolerated treatment well    Behavior During Therapy [facility] for tasks assessed/performed              Past Medical History:  Diagnosis Date   Allergy    Anxiety    Asthma    exercise induced   Chicken pox    Chronic pain    PTSD (post-traumatic stress disorder)    Past Surgical History:  Procedure Laterality Date   FRACTURE SURGERY     TONSILLECTOMY     TONSILLECTOMY AND ADENOIDECTOMY  1997   Patient Active Problem List   Diagnosis Date Noted   Wart of hand 05/29/2023   Right shoulder pain 05/29/2023   Other fatigue 01/19/2023   Food allergy 04/26/2022   Rash 04/25/2022   IUD (intrauterine device) in place 04/24/2022   Cyst, dermoid, scalp and neck 03/31/2022   Mass of right breast 02/01/2022   Weakness 11/09/2017   Anxiety 05/08/2017   Family history of breast cancer in female 03/05/2017   Rhinitis, allergic 11/30/2014   Psoriasis 11/30/2014   Family history of hypothyroidism 07/31/2014   Numbness 06/15/2014   Allergic rhinitis 03/12/2013   Physical exam, annual 06/19/2012    PCP: Myrlene Broker, MD   REFERRING PROVIDER: Myrlene Broker, MD   REFERRING DIAG: Left leg pain [M79.605]   THERAPY DIAG:  Chronic pain of left knee  Pain in left hip  Muscle weakness (generalized)  Rationale for Evaluation and Treatment: Rehabilitation  ONSET DATE: 1 month ago  SUBJECTIVE:   Per eval - Pt arrives to PT noted L leg pain that started about a month ago. She reports she hasn't been doing a lot of heavy lifting and with her exercises she had added sprints  didn't hear or a pop but had a sharp pain right in the middle of the L quad. She said the pain was very intense and tried icing and taking medication for pain. She attempted to play kickball but noticed she was unable to play. She noted it had been getting  better and 2 weeks ago after playing kickball she didn't do any running and just played out field and that night or the next day noted intense pain which since then the groin had been also aggravated.    SUBJECTIVE STATEMENT: 07/19/2023 Pt states she is really motivated to get back to powerlifting, tried squatting 100# high bar and felt pretty good. Feels like ACE wrap and exercises have been helpful. 2-3/10 pain at present, mostly in quad.    PERTINENT HISTORY: Hx of PTSD, Chronic pain PAIN:  Are you having pain? Yes: NPRS scale: 2-3/10 pain in quad Pain location: L quad and adductor  Pain description: aching sore Aggravating factors: walking, standing, moving, stairs, getting in out of a of the car of the couch  Relieving factors: Ice, foam  PRECAUTIONS: None  RED FLAGS: None   WEIGHT BEARING RESTRICTIONS: No  FALLS:  Has patient fallen in last 6 months? 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    OBJECTIVE: *Unless otherwise noted by date, all objective measures were captured at initial evaluation.   DIAGNOSTIC FINDINGS:  N/A  PATIENT SURVEYS:  FOTO 27% precited 62%  COGNITION: Overall cognitive status: Within functional limits for tasks assessed     SENSATION: WFL  POSTURE: rounded shoulders and forward head  PALPATION: TTP along the mid muscle belly of the rectus femoris/ Sartorious on the L, and soreness along the L adductor longus with mutlple trigger points noted.   Tenderness noted bil along both distal biceps femoris and semi-membranous tendons.  LOWER EXTREMITY ROM:  Active ROM Right eval Left eval  Hip flexion    Hip extension    Hip abduction    Hip adduction    Hip internal rotation    Hip external rotation    Knee flexion    Knee extension    Ankle dorsiflexion    Ankle plantarflexion    Ankle inversion    Ankle eversion     (Blank rows = not tested)  LOWER EXTREMITY MMT:  MMT Right eval Left eval  Hip flexion 5 4  Hip extension    Hip abduction 5 4 P!  Hip adduction 4+ 4+  Hip internal rotation    Hip external rotation    Knee flexion 5 4+  Knee extension 4+ 4+  Ankle dorsiflexion    Ankle plantarflexion    Ankle inversion    Ankle eversion     (Blank rows = not tested)   GAIT: Distance walked: 110 ft to tx room from waiting area Assistive device utilized: None Level of assistance: Complete Independence Comments: unremarkable.    TODAY'S TREATMENT:                                                                                                                              [facility] Adult PT Treatment:                                                DATE: 07/19/23 Therapeutic Exercise: Hamstring stretch supine x30sec  Sidelying hip circles x15 CW/CCW BIL cues for hip positioning Side lunge w/ adductor stretch 3x15 sec BIL cues for posterior weight hisft Standing hip flexor stretch (lunge position) + UE elevation/sidebending x10 BIL Active hamstring stretch 90-90 x8 BIL HEP handout + education  Therapeutic Activity: 10# KB full squat x10 15# full squat + ML weight shifts x10 High bar back squat (barbell only) 2x8 full ROM, education/discussion re: mechanics and strategies to manage loads as she progresses back to typical activity    [facility] Adult PT Treatment:  DATE: 07/11/2023 Therapeutic Exercise: Hip flexor stretch thomas test position 2 x 30 stretch Adductor stretch 2  x 30  Navigated 6 inch steps with quad wrap and sitting down with reduction in quad pain.  Provider initial HEp Manual Therapy: MTPR along the rectus femoris x 2, adductor longus x 1 ( showed patient how to perform it.)   Self Care: ACE wrapped around the L thigh to prevent/ reduce pulling of the quad with activation   PATIENT EDUCATION:  Education details: rationale for interventions, HEP  Person educated: Patient Education method: Explanation, Verbal cues, and Handouts Education comprehension: verbalized understanding  HOME EXERCISE PROGRAM: Access Code: [email or web address] URL: [email or web address] Date: 07/19/2023 Prepared by: Fransisco Hertz  Exercises - Sidelying HIp Circles (BKA)  - 1 x daily - 7 x weekly - 2 sets - 15 reps - Butterfly Groin Stretch  - 7 x weekly - 2 sets - 2- 3 reps - 30-60 sec hold - Side Lunge Adductor Stretch  - 1 x daily - 7 x weekly - 2 sets - 2 - 3 reps - 30-60 sec hold - Supine Hamstring Stretch  - 1 x daily - 7 x weekly - 2-3 sets - 6-8 reps - Standing Hip Flexor Stretch  - 1 x daily - 7 x weekly - 2-3 sets - 6-8 reps  ASSESSMENT:  CLINICAL IMPRESSION: 07/19/2023 Pt arrives w/ 2-3/10 pain mostly in L quad, notes improvement since eval and has tried working back into gym activities. She does well with HEP review, min cueing mostly for hip positioning during hip circles, able to progress hamstring and hip flexor stretches to incorporate more active components. We also spend a fair portion of session looking at pt typical gym activities, focusing on her warmup routine and high bar squat mechanics w/ barbell, discussion/education re: gradual progression and appropriate loading strategies. No adverse events, pt endorses gradually improving pain throughout session with eventual resolution. Recommend continuing along current POC in order to address relevant deficits and improve functional tolerance. Pt departs today's session in no acute distress, all voiced  questions/concerns addressed appropriately from PT perspective.    Per eval - Patient is a 32 y.o. y.o who was seen today for physical therapy evaluation and treatment for F with dx of L leg pain. She demonstrates weakness secondary to pain with MMT of the L quad and that started over a month ago as a result of a likely strain that occurred after sprinting that resulted in pain, which she re-injured/ exacerbated approximately 2 weeks ago when she went to play kickball. Based on assessment likelihood of a strain is high, she responded well in session to [facility] techniques around the involved area to relieve tension and utilized a quad wrap to reduce pulling with activity which she noted relief of tension with stairs and sitting. Adductor pain likely due to a result of over activity with exercise and compensation as well as the RLE. She would benefit from physical therapy to reduce LLE pain, improve strength, promote efficient posture with lifting and maximize function by addressing the deficits listed.   OBJECTIVE IMPAIRMENTS: decreased activity tolerance, decreased endurance, decreased strength, increased fascial restrictions, increased muscle spasms, improper body mechanics, postural dysfunction, and pain.   ACTIVITY LIMITATIONS: carrying, lifting, standing, squatting, and stairs  PARTICIPATION LIMITATIONS: community activity and exercise  PERSONAL FACTORS: 1 comorbidity: PTSD  are also affecting patient's functional outcome.   REHAB POTENTIAL: Excellent  CLINICAL DECISION MAKING: Stable/uncomplicated  EVALUATION COMPLEXITY: Low   GOALS: Goals reviewed  with patient? Yes  SHORT TERM GOALS: Target date: 08/08/2023  Pt to be IND with initial HEP for therapeutic progression Baseline: Goal status: INITIAL  2.  PT to verbalize/ demo efficient posture and lifting mechanics to prevent and reduce knee  pain.  Baseline:  Goal status: INITIAL  3.  Pt to report pain ti </= 4/10 max to demo  improvement in function.  Baseline:  Goal status: INITIAL  LONG TERM GOALS: Target date: 09/05/2023  Increase LLE gross strength to >/= 4+/5 with no pain during testing to demo improvement in condition Baseline:  Goal status: INITIAL  2.  P t to be able to navigate steps, perform ADLs and work related tasks without limitation or issues  Baseline:  Goal status: INITIAL  3.  Pt to return to exercise and weight lifting per her personal goals to maximize QOL Baseline:  Goal status: INITIAL  4.  Improve FOTO score to >/= 62% to demo improvement in function Baseline:  Goal status: INITIAL  5.  Pt to be IND with all HEP and will be able to maintain and progress their current LOF IND. Baseline:  Goal status: INITIAL  PLAN:  PT FREQUENCY: 1-2x/week  PT DURATION: 8 weeks  PLANNED INTERVENTIONS: Therapeutic exercises, Therapeutic activity, Neuromuscular re-education, Balance training, Gait training, Patient/Family education, Self Care, Joint mobilization, Stair training, Dry Needling, Electrical stimulation, Cryotherapy, Moist heat, Taping, Ultrasound, Ionotophoresis 4mg /ml Dexamethasone, and Manual therapy  PLAN FOR NEXT SESSION: Review/ update HEP PRN. STW around L mid distal quad/ adductor longus strain, continue working on Museum/gallery curator, loading strategies   Ashley Murrain PT, DPT 07/19/2023 5:16 PM

## 2023-07-19 ENCOUNTER — Encounter: Payer: Self-pay | Admitting: Physical Therapy

## 2023-07-19 ENCOUNTER — Ambulatory Visit: Payer: BC Managed Care – PPO | Attending: Internal Medicine | Admitting: Physical Therapy

## 2023-07-19 DIAGNOSIS — M6281 Muscle weakness (generalized): Secondary | ICD-10-CM | POA: Insufficient documentation

## 2023-07-19 DIAGNOSIS — G8929 Other chronic pain: Secondary | ICD-10-CM | POA: Diagnosis not present

## 2023-07-19 DIAGNOSIS — M25552 Pain in left hip: Secondary | ICD-10-CM | POA: Diagnosis not present

## 2023-07-19 DIAGNOSIS — M25562 Pain in left knee: Secondary | ICD-10-CM | POA: Diagnosis not present

## 2023-07-20 NOTE — Therapy (Signed)
OUTPATIENT PHYSICAL THERAPY TREATMENT NOTE   Patient Name: Ashley Bond MRN: [id number] DOB:1991-10-16, 32 y.o., female Today's Date: 07/23/2023  END OF SESSION:  PT End of Session - 07/23/23 1531     Visit Number 3    Number of Visits 9    Date for PT Re-Evaluation 09/05/23    PT Start Time 1531    PT Stop Time 1623    PT Time Calculation (min) 52 min    Activity Tolerance Patient tolerated treatment well    Behavior During Therapy [facility] for tasks assessed/performed               Past Medical History:  Diagnosis Date   Allergy    Anxiety    Asthma    exercise induced   Chicken pox    Chronic pain    PTSD (post-traumatic stress disorder)    Past Surgical History:  Procedure Laterality Date   FRACTURE SURGERY     TONSILLECTOMY     TONSILLECTOMY AND ADENOIDECTOMY  1997   Patient Active Problem List   Diagnosis Date Noted   Wart of hand 05/29/2023   Right shoulder pain 05/29/2023   Other fatigue 01/19/2023   Food allergy 04/26/2022   Rash 04/25/2022   IUD (intrauterine device) in place 04/24/2022   Cyst, dermoid, scalp and neck 03/31/2022   Mass of right breast 02/01/2022   Weakness 11/09/2017   Anxiety 05/08/2017   Family history of breast cancer in female 03/05/2017   Rhinitis, allergic 11/30/2014   Psoriasis 11/30/2014   Family history of hypothyroidism 07/31/2014   Numbness 06/15/2014   Allergic rhinitis 03/12/2013   Physical exam, annual 06/19/2012    PCP: Myrlene Broker, MD   REFERRING PROVIDER: Myrlene Broker, MD   REFERRING DIAG: Left leg pain [M79.605]   THERAPY DIAG:  Chronic pain of left knee  Pain in left hip  Muscle weakness (generalized)  Rationale for Evaluation and Treatment: Rehabilitation  ONSET DATE: 1 month ago  SUBJECTIVE:   Per eval - Pt arrives to PT noted L leg pain that started about a month ago. She reports she hasn't been doing a lot of heavy lifting and with her exercises she had added sprints  didn't hear or a pop but had a sharp pain right in the middle of the L quad. She said the pain was very intense and tried icing and taking medication for pain. She attempted to play kickball but noticed she was unable to play. She noted it had been getting  better and 2 weeks ago after playing kickball she didn't do any running and just played out field and that night or the next day noted intense pain which since then the groin had been also aggravated.    SUBJECTIVE STATEMENT: 07/23/2023 Pt arrives w/ 1-2/10 pain in L quad. States she has continued progressing activities, did a 185# DL without trouble, continuing to squat. Also went for a jog in her neighborhood (relatively flat) and felt good. Having a bit of soreness/pain when fatigued but overall progressing well. Reports no issues with HEP   PERTINENT HISTORY: Hx of PTSD, Chronic pain PAIN:  Are you having pain? Yes: NPRS scale: 1-2/10 R quad  Pain location: L quad and adductor  Pain description: aching sore Aggravating factors: walking, standing, moving, stairs, getting in out of a of the car of the couch  Relieving factors: Ice, foam  PRECAUTIONS: None  RED FLAGS: None   WEIGHT BEARING RESTRICTIONS: No  FALLS:  Has patient fallen in last 6 months? 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    OBJECTIVE: *Unless otherwise noted by date, all objective measures were captured at initial evaluation.   DIAGNOSTIC FINDINGS:  N/A  PATIENT SURVEYS:  FOTO 27% precited 62%  COGNITION: Overall cognitive status: Within functional limits for tasks assessed     SENSATION: WFL  POSTURE: rounded shoulders and forward head  PALPATION: TTP along the mid muscle belly of the rectus femoris/  Sartorious on the L, and soreness along the L adductor longus with mutlple trigger points noted.  Tenderness noted bil along both distal biceps femoris and semi-membranous tendons.  LOWER EXTREMITY ROM:  Active ROM Right eval Left eval  Hip flexion    Hip extension    Hip abduction    Hip adduction    Hip internal rotation    Hip external rotation    Knee flexion    Knee extension    Ankle dorsiflexion    Ankle plantarflexion    Ankle inversion    Ankle eversion     (Blank rows = not tested)  LOWER EXTREMITY MMT:  MMT Right eval Left eval  Hip flexion 5 4  Hip extension    Hip abduction 5 4 P!  Hip adduction 4+ 4+  Hip internal rotation    Hip external rotation    Knee flexion 5 4+  Knee extension 4+ 4+  Ankle dorsiflexion    Ankle plantarflexion    Ankle inversion    Ankle eversion     (Blank rows = not tested)   GAIT: Distance walked: 110 ft to tx room from waiting area Assistive device utilized: None Level of assistance: Complete Independence Comments: unremarkable.    TODAY'S TREATMENT:                                                                                                                              [facility] Adult PT Treatment:                                                DATE: 07/23/23 Therapeutic Exercise: Mobility squats x10 BW, 25# x12 Cybex hip adduction 25# x12 RLE, 37.5# x12, 50# x12 Cybex hip extension RLE 50# x12, 75# x10 Omega knee extension 15# cues for positioning and setup, BIL, 3x10 Hex bar hinge (bar only) 2x8 cues for form/mechanics Hex bar squat/hinge 135# (1 plate each side) x5 Significant time spent with education/discussion re: programming of exercises, gradual progression, monitoring symptoms and modifying accordingly, using pain/RPE as means of quantifying progression Also encouraged introduction of machine knee extensions  as performed today in clinic, 2-3 sets of 8-10 repetitions at 10-15# pending tolerance, modifying if  needed    [facility] Adult PT Treatment:                                                DATE: 07/19/23 Therapeutic Exercise: Hamstring stretch supine x30sec  Sidelying hip circles x15 CW/CCW BIL cues for hip positioning Side lunge w/ adductor stretch 3x15 sec BIL cues for posterior weight hisft Standing hip flexor stretch (lunge position) + UE elevation/sidebending x10 BIL Active hamstring stretch 90-90 x8 BIL HEP handout + education  Therapeutic Activity: 10# KB full squat x10 15# full squat + ML weight shifts x10 High bar back squat (barbell only) 2x8 full ROM, education/discussion re: mechanics and strategies to manage loads as she progresses back to typical activity    [facility] Adult PT Treatment:                                                DATE: 07/11/2023 Therapeutic Exercise: Hip flexor stretch thomas test position 2 x 30 stretch Adductor stretch 2 x 30  Navigated 6 inch steps with quad wrap and sitting down with reduction in quad pain.  Provider initial HEp Manual Therapy: MTPR along the rectus femoris x 2, adductor longus x 1 ( showed patient how to perform it.)   Self Care: ACE wrapped around the L thigh to prevent/ reduce pulling of the quad with activation   PATIENT EDUCATION:  Education details: rationale for interventions, HEP  Person educated: Patient Education method: Explanation, Verbal cues, and Handouts Education comprehension: verbalized understanding  HOME EXERCISE PROGRAM: Access Code: [email or web address] URL: [email or web address] Date: 07/19/2023 Prepared by: Fransisco Hertz  Exercises - Sidelying HIp Circles (BKA)  - 1 x daily - 7 x weekly - 2 sets - 15 reps - Butterfly Groin Stretch  - 7 x weekly - 2 sets - 2- 3 reps - 30-60 sec hold - Side Lunge Adductor Stretch  - 1 x daily - 7 x weekly - 2 sets - 2 - 3 reps - 30-60 sec hold - Supine Hamstring Stretch  - 1 x daily - 7 x weekly - 2-3 sets - 6-8 reps - Standing Hip Flexor Stretch  - 1 x daily - 7 x  weekly - 2-3 sets - 6-8 reps  ASSESSMENT:  CLINICAL IMPRESSION: 07/23/2023 Pt arrives w/ 1-2/10 pain, reports continuing to gradual progress towards PLOF. Continuing to spend significant time w/ education/discussion re: monitoring symptoms and modifying activities accordingly, programming exercises, and gradual progression with respect to symptom tolerance. Pt tolerates activity well today, mild transient "twinge" with fatigue on hip adduction and extension, fatigue/soreness with knee extensions but no increase in resting pain. Also working on acclimating pt to hex bar squat/hinge at end of session with cues for mechanics/setup. Pt reports reduced stiffness at end of session and no increase in pain, no adverse events. Recommend continuing along current POC in order to address relevant deficits and improve functional tolerance. Pt departs today's session in no acute distress, all voiced questions/concerns addressed appropriately from PT perspective.    Per eval - Patient is a 32 y.o. y.o who was seen today for physical therapy evaluation and treatment for  F with dx of L leg pain. She demonstrates weakness secondary to pain with MMT of the L quad and that started over a month ago as a result of a likely strain that occurred after sprinting that resulted in pain, which she re-injured/ exacerbated approximately 2 weeks ago when she went to play kickball. Based on assessment likelihood of a strain is high, she responded well in session to [facility] techniques around the involved area to relieve tension and utilized a quad wrap to reduce pulling with activity which she noted relief of tension with stairs and sitting. Adductor pain likely due to a result of over activity with exercise and compensation as well as the RLE. She would benefit from physical therapy to reduce LLE pain, improve strength, promote efficient posture with lifting and maximize function by addressing the deficits listed.   OBJECTIVE IMPAIRMENTS:  decreased activity tolerance, decreased endurance, decreased strength, increased fascial restrictions, increased muscle spasms, improper body mechanics, postural dysfunction, and pain.   ACTIVITY LIMITATIONS: carrying, lifting, standing, squatting, and stairs  PARTICIPATION LIMITATIONS: community activity and exercise  PERSONAL FACTORS: 1 comorbidity: PTSD  are also affecting patient's functional outcome.   REHAB POTENTIAL: Excellent  CLINICAL DECISION MAKING: Stable/uncomplicated  EVALUATION COMPLEXITY: Low   GOALS: Goals reviewed with patient? Yes  SHORT TERM GOALS: Target date: 08/08/2023  Pt to be IND with initial HEP for therapeutic progression Baseline: Goal status: INITIAL  2.  PT to verbalize/ demo efficient posture and lifting mechanics to prevent and reduce knee  pain.  Baseline:  Goal status: INITIAL  3.  Pt to report pain ti </= 4/10 max to demo improvement in function.  Baseline:  Goal status: INITIAL  LONG TERM GOALS: Target date: 09/05/2023  Increase LLE gross strength to >/= 4+/5 with no pain during testing to demo improvement in condition Baseline:  Goal status: INITIAL  2.  P t to be able to navigate steps, perform ADLs and work related tasks without limitation or issues  Baseline:  Goal status: INITIAL  3.  Pt to return to exercise and weight lifting per her personal goals to maximize QOL Baseline:  Goal status: INITIAL  4.  Improve FOTO score to >/= 62% to demo improvement in function Baseline:  Goal status: INITIAL  5.  Pt to be IND with all HEP and will be able to maintain and progress their current LOF IND. Baseline:  Goal status: INITIAL  PLAN:  PT FREQUENCY: 1-2x/week  PT DURATION: 8 weeks  PLANNED INTERVENTIONS: Therapeutic exercises, Therapeutic activity, Neuromuscular re-education, Balance training, Gait training, Patient/Family education, Self Care, Joint mobilization, Stair training, Dry Needling, Electrical stimulation,  Cryotherapy, Moist heat, Taping, Ultrasound, Ionotophoresis 4mg /ml Dexamethasone, and Manual therapy  PLAN FOR NEXT SESSION: Review/ update HEP PRN. 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

## 2023-07-23 ENCOUNTER — Ambulatory Visit: Payer: BC Managed Care – PPO | Admitting: Physical Therapy

## 2023-07-23 ENCOUNTER — Encounter: Payer: Self-pay | Admitting: Physical Therapy

## 2023-07-23 DIAGNOSIS — M25552 Pain in left hip: Secondary | ICD-10-CM | POA: Diagnosis not present

## 2023-07-23 DIAGNOSIS — M6281 Muscle weakness (generalized): Secondary | ICD-10-CM

## 2023-07-23 DIAGNOSIS — G8929 Other chronic pain: Secondary | ICD-10-CM | POA: Diagnosis not present

## 2023-07-23 DIAGNOSIS — M25562 Pain in left knee: Secondary | ICD-10-CM | POA: Diagnosis not present

## 2023-07-25 DIAGNOSIS — F4310 Post-traumatic stress disorder, unspecified: Secondary | ICD-10-CM | POA: Diagnosis not present

## 2023-07-30 ENCOUNTER — Other Ambulatory Visit (HOSPITAL_COMMUNITY)
Admission: RE | Admit: 2023-07-30 | Discharge: 2023-07-30 | Disposition: A | Discharge status: Home / Self Care | Payer: BC Managed Care – PPO | Source: Ambulatory Visit | Attending: Advanced Practice Midwife | Admitting: Advanced Practice Midwife

## 2023-07-30 ENCOUNTER — Encounter: Payer: Self-pay | Admitting: Advanced Practice Midwife

## 2023-07-30 ENCOUNTER — Ambulatory Visit: Payer: BC Managed Care – PPO | Admitting: Advanced Practice Midwife

## 2023-07-30 VITALS — BP 135/86 | HR 82 | Ht 66.5 in | Wt 177.6 lb

## 2023-07-30 DIAGNOSIS — G8929 Other chronic pain: Secondary | ICD-10-CM

## 2023-07-30 DIAGNOSIS — Z01419 Encounter for gynecological examination (general) (routine) without abnormal findings: Secondary | ICD-10-CM

## 2023-07-30 DIAGNOSIS — N6321 Unspecified lump in the left breast, upper outer quadrant: Secondary | ICD-10-CM | POA: Diagnosis not present

## 2023-07-30 DIAGNOSIS — N939 Abnormal uterine and vaginal bleeding, unspecified: Secondary | ICD-10-CM | POA: Diagnosis not present

## 2023-07-30 DIAGNOSIS — R102 Pelvic and perineal pain: Secondary | ICD-10-CM

## 2023-07-30 DIAGNOSIS — T839XXA Unspecified complication of genitourinary prosthetic device, implant and graft, initial encounter: Secondary | ICD-10-CM

## 2023-07-30 DIAGNOSIS — N6311 Unspecified lump in the right breast, upper outer quadrant: Secondary | ICD-10-CM | POA: Diagnosis not present

## 2023-07-30 DIAGNOSIS — N63 Unspecified lump in unspecified breast: Secondary | ICD-10-CM

## 2023-07-30 DIAGNOSIS — N6314 Unspecified lump in the right breast, lower inner quadrant: Secondary | ICD-10-CM

## 2023-07-30 NOTE — Progress Notes (Signed)
Subjective:     Ashley Bond is a 32 y.o. female here at [facility] for a routine exam.  Current complaints: breast lumps that change, mostly in upper outer quadrants of both breasts but one in lower mid/inner portion of right breast that is tender.  Pt had aunt dx with breast cancer at age 6 so is concerned.  Hx breast biopsies that were benign.  Also pt reports pelvic cramping and intermittent spotting in recent months. She has Liletta IUD, placed in 2023, and reports the pain/spotting didn't start initially but started within the last 3-4 months.  She is monogamous with one partner, her husband.    Do you have a primary care provider? yes Do you feel safe at home? yes  [employer] Visit from 05/29/2023 in [facility]  PHQ-2 Total Score 0       Health Maintenance Due  Topic Date Due   HIV Screening  Never done   Hepatitis C Screening  Never done   HPV VACCINES (2 - Risk 3-dose series) 07/23/2012   COVID-19 Vaccine (3 - Pfizer risk series) 02/04/2020   INFLUENZA VACCINE  05/17/2023     Risk factors for chronic health problems: Smoking: never Alchohol/how much: occasional, on weekends Pt BMI: Body mass index is 28.24 kg/m.   Gynecologic History No LMP recorded (lmp unknown). (Menstrual status: IUD). Contraception: Liletta IUD Last Pap: 01/31/22. Results were: normal Last mammogram: n/a.   Obstetric History OB History  Gravida Para Term Preterm AB Living  0 0 0 0 0 0  SAB IAB Ectopic Multiple Live Births  0 0 0 0       The following portions of the patient's history were reviewed and updated as appropriate: allergies, current medications, past family history, past medical history, past social history, past surgical history, and problem list.  Review of Systems Pertinent items noted in HPI and remainder of comprehensive ROS otherwise negative.    Objective:  BP 135/86   Pulse 82   Ht 5' 6.5" (1.689 m)   Wt 177 lb 9.6  oz (80.6 kg)   LMP  (LMP Unknown)   BMI 28.24 kg/m   VS reviewed, nursing note reviewed,  Constitutional: well developed, well nourished, no distress HEENT: normocephalic, thyroid without enlargement or mass HEART: RRR, no murmurs rubs/gallops RESP: clear and equal to auscultation bilaterally in all lobes  Breast Exam:  right breast with multiple smooth, round, mobile masses in upper outer quadrant, one smooth round mass in lower mid/inner portion ~ 2 cm below areola, no skin or nipple changes or axillary nodes, left breast normal with multiple smooth round, mobile masses in upper outer portion of breast, no skin or nipple changes or axillary nodes Abdomen: soft Neuro: alert and oriented x 3 Skin: warm, dry Psych: affect normal Pelvic exam: Performed: Cervix pink, visually closed, without lesion, scant white creamy discharge, vaginal walls and external genitalia normal Bimanual exam: Cervix 0/long/high, firm, anterior, neg CMT, uterus nontender, nonenlarged, adnexa without tenderness, enlargement, or mass        Assessment/Plan:   1. Encounter for annual routine gynecological examination   2. Vaginal spotting --May be related to IUD, reviewed expected bleeding patterns with IUD --Will swab for infection and f/u with outpatient [place] given associated pain  - Cervicovaginal ancillary only( [place]) - US PELVIC COMPLETE WITH TRANSVAGINAL; Future  3. Mass of upper outer quadrant of left breast  - [place] LIMITED ULTRASOUND INCLUDING AXILLA LEFT  BREAST ; Future - MM Digital Diagnostic Bilat; Future  4. Mass of upper outer quadrant of right breast  - [place] LIMITED ULTRASOUND INCLUDING AXILLA RIGHT BREAST; Future - MM Digital Diagnostic Bilat; Future  5. Mass of lower inner quadrant of right breast  - [place] LIMITED ULTRASOUND INCLUDING AXILLA RIGHT BREAST; Future - MM Digital Diagnostic Bilat; Future  6. Chronic pelvic pain in female -Pain intermittent, sometimes after intercourse  7.  Complication of intrauterine device (IUD), unspecified complication, initial encounter (HCC)  - US PELVIC COMPLETE WITH TRANSVAGINAL; Future   Return in about 1 year (around 07/29/2024) for annual exam.   Sharen Counter, CNM 3:40 PM

## 2023-07-30 NOTE — Progress Notes (Signed)
Pt. Presents for Annual. Pt. Is having lower abdominal pain and is concerned with lumps in breast. Is having spotting and pain with IUD and wants to make sure that everything is ok.

## 2023-07-31 LAB — CERVICOVAGINAL ANCILLARY ONLY
Bacterial Vaginitis (gardnerella): NEGATIVE
Candida Glabrata: NEGATIVE
Candida Vaginitis: NEGATIVE
Chlamydia: NEGATIVE
Comment: NEGATIVE
Comment: NEGATIVE
Comment: NEGATIVE
Comment: NEGATIVE
Comment: NEGATIVE
Comment: NORMAL
Neisseria Gonorrhea: NEGATIVE
Trichomonas: NEGATIVE

## 2023-08-02 ENCOUNTER — Other Ambulatory Visit: Payer: BC Managed Care – PPO

## 2023-08-03 ENCOUNTER — Ambulatory Visit
Admission: RE | Admit: 2023-08-03 | Discharge: 2023-08-03 | Disposition: A | Discharge status: Home / Self Care | Payer: BC Managed Care – PPO | Source: Ambulatory Visit | Attending: Advanced Practice Midwife | Admitting: Advanced Practice Midwife

## 2023-08-03 DIAGNOSIS — R102 Pelvic and perineal pain: Secondary | ICD-10-CM | POA: Diagnosis not present

## 2023-08-03 DIAGNOSIS — T839XXA Unspecified complication of genitourinary prosthetic device, implant and graft, initial encounter: Secondary | ICD-10-CM

## 2023-08-03 DIAGNOSIS — N939 Abnormal uterine and vaginal bleeding, unspecified: Secondary | ICD-10-CM

## 2023-08-03 DIAGNOSIS — T8339XA Other mechanical complication of intrauterine contraceptive device, initial encounter: Secondary | ICD-10-CM | POA: Diagnosis not present

## 2023-08-07 NOTE — Therapy (Signed)
OUTPATIENT PHYSICAL THERAPY TREATMENT NOTE   Patient Name: Ashley Bond MRN: [id number] DOB:[birth year], 32 y.o., female Today's Date: 08/07/2023  END OF SESSION:      Past Medical History:  Diagnosis Date   Allergy    Anxiety    Asthma    exercise induced   Chicken pox    Chronic pain    PTSD (post-traumatic stress disorder)    Past Surgical History:  Procedure Laterality Date   FRACTURE SURGERY     TONSILLECTOMY     TONSILLECTOMY AND ADENOIDECTOMY  1997   Patient Active Problem List   Diagnosis Date Noted   Fibrous breast lumps 07/30/2023   Wart of hand 05/29/2023   Right shoulder pain 05/29/2023   Other fatigue 01/19/2023   Food allergy 04/26/2022   Rash 04/25/2022   IUD (intrauterine device) in place 04/24/2022   Cyst, dermoid, scalp and neck 03/31/2022   Mass of right breast 02/01/2022   Weakness 11/09/2017   Anxiety 05/08/2017   Family history of breast cancer in female 03/05/2017   Rhinitis, allergic 11/30/2014   Psoriasis 11/30/2014   Family history of hypothyroidism 07/31/2014   Numbness 06/15/2014   Allergic rhinitis 03/12/2013   Physical exam, annual 06/19/2012    PCP: Myrlene Broker, MD   REFERRING PROVIDER: Myrlene Broker, MD   REFERRING DIAG: Left leg pain [M79.605]   THERAPY DIAG:  No diagnosis found.  Rationale for Evaluation and Treatment: Rehabilitation  ONSET DATE: 1 month ago  SUBJECTIVE:   Per eval - Pt arrives to PT noted L leg pain that started about a month ago. She reports she hasn't been doing a lot of heavy lifting and with her exercises she had added sprints didn't hear or a pop but had a sharp pain right in the middle of the L quad. She said the pain was very intense and tried icing and taking medication for pain. She attempted to play kickball but noticed she was unable to play. She noted it had been getting  better and 2 weeks ago after playing kickball she didn't do any running and just played out  field and that night or the next day noted intense pain which since then the groin had been also aggravated.    SUBJECTIVE STATEMENT: 08/07/2023 ***  *** Pt arrives w/ 1-2/10 pain in L quad. States she has continued progressing activities, did a 185# DL without trouble, continuing to squat. Also went for a jog in her neighborhood (relatively flat) and felt good. Having a bit of soreness/pain when fatigued but overall progressing well. Reports no issues with HEP   PERTINENT HISTORY: Hx of PTSD, Chronic pain PAIN:  Are you having pain? Yes: NPRS scale: 1-2/10 R quad ***  Pain location: L quad and adductor  Pain description: aching sore Aggravating factors: walking, standing, moving, stairs, getting in out of a of the car of the couch  Relieving factors: Ice, foam  PRECAUTIONS: None  RED FLAGS: None   WEIGHT BEARING RESTRICTIONS: No  FALLS:  Has patient fallen in last 6 months? 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    OBJECTIVE: *Unless otherwise noted by date, all objective measures were captured at initial evaluation.   DIAGNOSTIC FINDINGS:  N/A  PATIENT SURVEYS:  FOTO 27% precited  62%  COGNITION: Overall cognitive status: Within functional limits for tasks assessed     SENSATION: WFL  POSTURE: rounded shoulders and forward head  PALPATION: TTP along the mid muscle belly of the rectus femoris/ Sartorious on the L, and soreness along the L adductor longus with mutlple trigger points noted.  Tenderness noted bil along both distal biceps femoris and semi-membranous tendons.  LOWER EXTREMITY ROM:  Active ROM Right eval Left eval  Hip flexion    Hip extension    Hip abduction    Hip adduction    Hip internal rotation    Hip  external rotation    Knee flexion    Knee extension    Ankle dorsiflexion    Ankle plantarflexion    Ankle inversion    Ankle eversion     (Blank rows = not tested)  LOWER EXTREMITY MMT:  MMT Right eval Left eval  Hip flexion 5 4  Hip extension    Hip abduction 5 4 P!  Hip adduction 4+ 4+  Hip internal rotation    Hip external rotation    Knee flexion 5 4+  Knee extension 4+ 4+  Ankle dorsiflexion    Ankle plantarflexion    Ankle inversion    Ankle eversion     (Blank rows = not tested)   GAIT: Distance walked: 110 ft to tx room from waiting area Assistive device utilized: None Level of assistance: Complete Independence Comments: unremarkable.    TODAY'S TREATMENT:                                                                                                                              [facility] Adult PT Treatment:                                                DATE: 08/08/23 Therapeutic Exercise: *** Manual Therapy: *** Neuromuscular re-ed: *** Therapeutic Activity: *** Modalities: *** Self Care: [facility] Treatment:                                                DATE: 07/23/23 Therapeutic Exercise: Mobility squats x10 BW, 25# x12 Cybex hip adduction 25# x12 RLE, 37.5# x12, 50# x12 Cybex hip extension RLE 50# x12, 75# x10 Omega knee extension 15# cues for positioning and setup, BIL, 3x10 Hex bar hinge (bar only) 2x8 cues for form/mechanics Hex bar squat/hinge 135# (1 plate each side) x5 Significant time spent with education/discussion re: programming of exercises, gradual progression, monitoring symptoms and modifying accordingly, using pain/RPE as means of quantifying progression Also encouraged introduction of machine knee extensions as performed today in clinic, 2-3 sets of 8-10  repetitions at 10-15# pending tolerance, modifying if needed    [facility] Adult PT Treatment:                                                DATE: 07/19/23 Therapeutic  Exercise: Hamstring stretch supine x30sec  Sidelying hip circles x15 CW/CCW BIL cues for hip positioning Side lunge w/ adductor stretch 3x15 sec BIL cues for posterior weight hisft Standing hip flexor stretch (lunge position) + UE elevation/sidebending x10 BIL Active hamstring stretch 90-90 x8 BIL HEP handout + education  Therapeutic Activity: 10# KB full squat x10 15# full squat + ML weight shifts x10 High bar back squat (barbell only) 2x8 full ROM, education/discussion re: mechanics and strategies to manage loads as she progresses back to typical activity    [facility] Adult PT Treatment:                                                DATE: 07/11/2023 Therapeutic Exercise: Hip flexor stretch thomas test position 2 x 30 stretch Adductor stretch 2 x 30  Navigated 6 inch steps with quad wrap and sitting down with reduction in quad pain.  Provider initial HEp Manual Therapy: MTPR along the rectus femoris x 2, adductor longus x 1 ( showed patient how to perform it.)   Self Care: ACE wrapped around the L thigh to prevent/ reduce pulling of the quad with activation   PATIENT EDUCATION:  Education details: rationale for interventions, HEP  Person educated: Patient Education method: Explanation, Verbal cues, and Handouts Education comprehension: verbalized understanding  HOME EXERCISE PROGRAM: Access Code: [email or web address] URL: [email or web address] Date: 07/19/2023 Prepared by: Fransisco Hertz  Exercises - Sidelying HIp Circles (BKA)  - 1 x daily - 7 x weekly - 2 sets - 15 reps - Butterfly Groin Stretch  - 7 x weekly - 2 sets - 2- 3 reps - 30-60 sec hold - Side Lunge Adductor Stretch  - 1 x daily - 7 x weekly - 2 sets - 2 - 3 reps - 30-60 sec hold - Supine Hamstring Stretch  - 1 x daily - 7 x weekly - 2-3 sets - 6-8 reps - Standing Hip Flexor Stretch  - 1 x daily - 7 x weekly - 2-3 sets - 6-8 reps  ASSESSMENT:  CLINICAL IMPRESSION: 08/07/2023 ***  *** Pt arrives w/ 1-2/10  pain, reports continuing to gradual progress towards PLOF. Continuing to spend significant time w/ education/discussion re: monitoring symptoms and modifying activities accordingly, programming exercises, and gradual progression with respect to symptom tolerance. Pt tolerates activity well today, mild transient "twinge" with fatigue on hip adduction and extension, fatigue/soreness with knee extensions but no increase in resting pain. Also working on acclimating pt to hex bar squat/hinge at end of session with cues for mechanics/setup. Pt reports reduced stiffness at end of session and no increase in pain, no adverse events. Recommend continuing along current POC in order to address relevant deficits and improve functional tolerance. Pt departs today's session in no acute distress, all voiced questions/concerns addressed appropriately from PT perspective.    Per eval - Patient is a 32 y.o. y.o who was seen today for physical therapy evaluation and treatment for F with dx of L leg  pain. She demonstrates weakness secondary to pain with MMT of the L quad and that started over a month ago as a result of a likely strain that occurred after sprinting that resulted in pain, which she re-injured/ exacerbated approximately 2 weeks ago when she went to play kickball. Based on assessment likelihood of a strain is high, she responded well in session to [facility] techniques around the involved area to relieve tension and utilized a quad wrap to reduce pulling with activity which she noted relief of tension with stairs and sitting. Adductor pain likely due to a result of over activity with exercise and compensation as well as the RLE. She would benefit from physical therapy to reduce LLE pain, improve strength, promote efficient posture with lifting and maximize function by addressing the deficits listed.   OBJECTIVE IMPAIRMENTS: decreased activity tolerance, decreased endurance, decreased strength, increased fascial restrictions,  increased muscle spasms, improper body mechanics, postural dysfunction, and pain.   ACTIVITY LIMITATIONS: carrying, lifting, standing, squatting, and stairs  PARTICIPATION LIMITATIONS: community activity and exercise  PERSONAL FACTORS: 1 comorbidity: PTSD  are also affecting patient's functional outcome.   REHAB POTENTIAL: Excellent  CLINICAL DECISION MAKING: Stable/uncomplicated  EVALUATION COMPLEXITY: Low   GOALS: Goals reviewed with patient? Yes  SHORT TERM GOALS: Target date: 08/08/2023  Pt to be IND with initial HEP for therapeutic progression Baseline: 08/08/23: ***  Goal status: ***   2.  PT to verbalize/ demo efficient posture and lifting mechanics to prevent and reduce knee  pain.  Baseline:  08/08/23: ***  Goal status: ***   3.  Pt to report pain ti </= 4/10 max to demo improvement in function.  Baseline:  08/08/23: ***  Goal status: ***   LONG TERM GOALS: Target date: 09/05/2023  Increase LLE gross strength to >/= 4+/5 with no pain during testing to demo improvement in condition Baseline:  Goal status: INITIAL  2.  P t to be able to navigate steps, perform ADLs and work related tasks without limitation or issues  Baseline:  Goal status: INITIAL  3.  Pt to return to exercise and weight lifting per her personal goals to maximize QOL Baseline:  Goal status: INITIAL  4.  Improve FOTO score to >/= 62% to demo improvement in function Baseline:  Goal status: INITIAL  5.  Pt to be IND with all HEP and will be able to maintain and progress their current LOF IND. Baseline:  Goal status: INITIAL  PLAN:  PT FREQUENCY: 1-2x/week  PT DURATION: 8 weeks  PLANNED INTERVENTIONS: Therapeutic exercises, Therapeutic activity, Neuromuscular re-education, Balance training, Gait training, Patient/Family education, Self Care, Joint mobilization, Stair training, Dry Needling, Electrical stimulation, Cryotherapy, Moist heat, Taping, Ultrasound, Ionotophoresis 4mg /ml  Dexamethasone, and Manual therapy  PLAN FOR NEXT SESSION: Review/ update HEP PRN. 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

## 2023-08-08 ENCOUNTER — Encounter: Payer: Self-pay | Admitting: Physical Therapy

## 2023-08-08 ENCOUNTER — Ambulatory Visit: Payer: BC Managed Care – PPO | Admitting: Physical Therapy

## 2023-08-08 DIAGNOSIS — G8929 Other chronic pain: Secondary | ICD-10-CM | POA: Diagnosis not present

## 2023-08-08 DIAGNOSIS — M25562 Pain in left knee: Secondary | ICD-10-CM | POA: Diagnosis not present

## 2023-08-08 DIAGNOSIS — M25552 Pain in left hip: Secondary | ICD-10-CM | POA: Diagnosis not present

## 2023-08-08 DIAGNOSIS — M6281 Muscle weakness (generalized): Secondary | ICD-10-CM

## 2023-08-13 ENCOUNTER — Encounter: Payer: BC Managed Care – PPO | Admitting: Physical Therapy

## 2023-08-15 DIAGNOSIS — F4310 Post-traumatic stress disorder, unspecified: Secondary | ICD-10-CM | POA: Diagnosis not present

## 2023-08-21 ENCOUNTER — Ambulatory Visit
Admission: RE | Admit: 2023-08-21 | Discharge: 2023-08-21 | Disposition: A | Discharge status: Home / Self Care | Payer: BC Managed Care – PPO | Source: Ambulatory Visit | Attending: Advanced Practice Midwife | Admitting: Advanced Practice Midwife

## 2023-08-21 DIAGNOSIS — N6321 Unspecified lump in the left breast, upper outer quadrant: Secondary | ICD-10-CM

## 2023-08-21 DIAGNOSIS — N6315 Unspecified lump in the right breast, overlapping quadrants: Secondary | ICD-10-CM | POA: Diagnosis not present

## 2023-08-21 DIAGNOSIS — N6323 Unspecified lump in the left breast, lower outer quadrant: Secondary | ICD-10-CM | POA: Diagnosis not present

## 2023-08-21 DIAGNOSIS — N6314 Unspecified lump in the right breast, lower inner quadrant: Secondary | ICD-10-CM

## 2023-08-21 DIAGNOSIS — N6311 Unspecified lump in the right breast, upper outer quadrant: Secondary | ICD-10-CM

## 2023-08-29 DIAGNOSIS — F4310 Post-traumatic stress disorder, unspecified: Secondary | ICD-10-CM | POA: Diagnosis not present

## 2023-09-19 DIAGNOSIS — F4310 Post-traumatic stress disorder, unspecified: Secondary | ICD-10-CM | POA: Diagnosis not present

## 2023-10-11 DIAGNOSIS — F4310 Post-traumatic stress disorder, unspecified: Secondary | ICD-10-CM | POA: Diagnosis not present

## 2023-10-27 ENCOUNTER — Encounter: Payer: Self-pay | Admitting: Internal Medicine

## 2023-10-29 MED ORDER — ACYCLOVIR 5 % EX OINT: 1.0000 | TOPICAL_OINTMENT | CUTANEOUS | 11 refills | Status: AC

## 2023-12-05 ENCOUNTER — Encounter: Payer: Self-pay | Admitting: Internal Medicine

## 2024-05-20 ENCOUNTER — Ambulatory Visit (INDEPENDENT_AMBULATORY_CARE_PROVIDER_SITE_OTHER): Payer: Self-pay | Admitting: Internal Medicine

## 2024-05-20 VITALS — BP 124/80 | HR 79 | Temp 98.2°F | Ht 66.5 in | Wt 186.0 lb

## 2024-05-20 DIAGNOSIS — R5383 Other fatigue: Secondary | ICD-10-CM

## 2024-05-20 DIAGNOSIS — Z8349 Family history of other endocrine, nutritional and metabolic diseases: Secondary | ICD-10-CM

## 2024-05-20 DIAGNOSIS — Z0000 Encounter for general adult medical examination without abnormal findings: Secondary | ICD-10-CM

## 2024-05-20 DIAGNOSIS — K625 Hemorrhage of anus and rectum: Secondary | ICD-10-CM | POA: Insufficient documentation

## 2024-05-20 NOTE — Progress Notes (Signed)
   Subjective:   Patient ID: Ashley Bond, female    DOB: November 23, 1990, 33 y.o.   MRN: [id number]  The patient is here for visit. Pertinent topics discussed: Discussed the use of AI scribe software for clinical note transcription with the patient, who gave verbal consent to proceed.  History of Present Illness Ashley Bond is a 33 year old female who presents with recent rectal bleeding.  She experienced rectal bleeding that was more consistent and heavier than her usual spotting while on vacation visiting her mother-in-law. The episode lasted for two to three days and was associated with straining during bowel movements. The bleeding has not recurred since returning home. She has a history of occasional rectal bleeding, typically minor and infrequent.    PMH, [facility], social history reviewed and updated  Review of Systems  Constitutional: Negative.   HENT: Negative.    Eyes: Negative.   Respiratory:  Negative for cough, chest tightness and shortness of breath.   Cardiovascular:  Negative for chest pain, palpitations and leg swelling.  Gastrointestinal:  Positive for anal bleeding. Negative for abdominal distention, abdominal pain, constipation, diarrhea, nausea and vomiting.  Musculoskeletal: Negative.   Skin: Negative.   Neurological: Negative.   Psychiatric/Behavioral: Negative.      Objective:  Physical Exam Constitutional:      Appearance: She is well-developed.  HENT:     Head: Normocephalic and atraumatic.  Cardiovascular:     Rate and Rhythm: Normal rate and regular rhythm.  Pulmonary:     Effort: Pulmonary effort is normal. No respiratory distress.     Breath sounds: Normal breath sounds. No wheezing or rales.  Abdominal:     General: Bowel sounds are normal. There is no distension.     Palpations: Abdomen is soft.     Tenderness: There is no abdominal tenderness. There is no rebound.  Musculoskeletal:     Cervical back: Normal range of motion.  Skin:     General: Skin is warm and dry.  Neurological:     Mental Status: She is alert and oriented to person, place, and time.     Coordination: Coordination normal.     Vitals:   05/20/24 1045  BP: 124/80  Pulse: 79  Temp: 98.2 F (36.8 C)  TempSrc: Oral  SpO2: 98%  Weight: 186 lb (84.4 kg)  Height: 5' 6.5 (1.689 m)    Assessment & Plan:

## 2024-05-20 NOTE — Assessment & Plan Note (Signed)
 Suspect internal hemorrhoids. Will monitor closely and if recurrent can refer to GI for colonoscopy.

## 2024-07-26 ENCOUNTER — Other Ambulatory Visit: Payer: Self-pay | Admitting: Medical Genetics

## 2024-07-26 DIAGNOSIS — Z006 Encounter for examination for normal comparison and control in clinical research program: Secondary | ICD-10-CM

## 2024-09-29 ENCOUNTER — Ambulatory Visit: Admitting: Internal Medicine

## 2024-09-29 ENCOUNTER — Encounter: Payer: Self-pay | Admitting: Internal Medicine

## 2024-09-29 VITALS — BP 110/60 | HR 82 | Temp 98.0°F | Wt 182.0 lb

## 2024-09-29 DIAGNOSIS — Z8349 Family history of other endocrine, nutritional and metabolic diseases: Secondary | ICD-10-CM

## 2024-09-29 DIAGNOSIS — Z0000 Encounter for general adult medical examination without abnormal findings: Secondary | ICD-10-CM

## 2024-09-29 DIAGNOSIS — K625 Hemorrhage of anus and rectum: Secondary | ICD-10-CM

## 2024-09-29 DIAGNOSIS — J101 Influenza due to other identified influenza virus with other respiratory manifestations: Secondary | ICD-10-CM

## 2024-09-29 DIAGNOSIS — L409 Psoriasis, unspecified: Secondary | ICD-10-CM | POA: Diagnosis not present

## 2024-09-29 DIAGNOSIS — R5383 Other fatigue: Secondary | ICD-10-CM

## 2024-09-29 LAB — POC COVID19 BINAXNOW: SARS Coronavirus 2 Ag: NEGATIVE

## 2024-09-29 LAB — POCT INFLUENZA A/B
Influenza A, POC: POSITIVE — AB
Influenza B, POC: NEGATIVE — AB

## 2024-09-29 MED ORDER — OSELTAMIVIR PHOSPHATE 75 MG PO CAPS: 75.0000 mg | ORAL_CAPSULE | Freq: Two times a day (BID) | ORAL | 0 refills | Status: AC

## 2024-09-29 MED ORDER — SELENIUM SULFIDE 2.5 % EX LOTN: TOPICAL_LOTION | Freq: Every day | CUTANEOUS | 5 refills | Status: AC | PRN

## 2024-09-29 NOTE — Patient Instructions (Addendum)
 We will send you to GI.  You do have flu and we have sent in tamiflu .

## 2024-09-29 NOTE — Progress Notes (Unsigned)
° °  Subjective:   Patient ID: Ashley Bond, female    DOB: [birth year], 33 y.o.   MRN: [id number]  The patient is here for physical. Pertinent topics discussed: Discussed the use of AI scribe software for clinical note transcription with the patient, who gave verbal consent to proceed.  History of Present Illness Ashley Bond is a 33 year old female who presents with respiratory symptoms and body aches.  She has been experiencing chest congestion, sinus issues, and body aches for the past two days. These symptoms are reminiscent of a respiratory or sinus infection. 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  She has noticed blood on the toilet paper when wiping, which has occurred a couple of times and is a source of concern for her.  She experiences irregular chest pains that are persistent but not worsening, even with reduced physical activity such as weightlifting. She attempts to alleviate these symptoms by sleeping with her arm elevated.  She is concerned about the texture of her stool, which she attributes to anxiety. She is unsure about what is normal and mentions taking fiber supplements and drinking a lot of water.  She requests a refill of her psoriasis shampoo, noting that her psoriasis tends to flare up in the winter. Additionally, she mentions a bump on her head that was recently removed.  No new breathing troubles aside from the current respiratory symptoms. No new skin spots or moles. No new diarrhea or constipation, but she is concerned about stool texture. No new or worsening symptoms related to her chest pains.  PMH, [facility], social history reviewed and updated  Review of Systems  Objective:  Physical Exam  Vitals:   09/29/24 1604  BP: 110/60  Pulse: 82  Temp: 98 F (36.7 C)  TempSrc: Oral  SpO2: 94%  Weight: 182 lb (82.6 kg)    Assessment & Plan:

## 2024-09-30 ENCOUNTER — Ambulatory Visit: Payer: Self-pay | Admitting: Internal Medicine

## 2024-09-30 DIAGNOSIS — J101 Influenza due to other identified influenza virus with other respiratory manifestations: Secondary | ICD-10-CM | POA: Insufficient documentation

## 2024-09-30 LAB — COMPREHENSIVE METABOLIC PANEL WITH GFR
ALT: 17 U/L (ref 0–35)
AST: 21 U/L (ref 5–37)
Albumin: 4.9 g/dL (ref 3.5–5.2)
Alkaline Phosphatase: 50 U/L (ref 39–117)
BUN: 7 mg/dL (ref 6–23)
CO2: 27 meq/L (ref 19–32)
Calcium: 9.5 mg/dL (ref 8.4–10.5)
Chloride: 100 meq/L (ref 96–112)
Creatinine, Ser: 0.71 mg/dL (ref 0.40–1.20)
GFR: 109.77 mL/min (ref >60.00)
Glucose, Bld: 95 mg/dL (ref 70–99)
Potassium: 3.9 meq/L (ref 3.5–5.1)
Sodium: 138 meq/L (ref 135–145)
Total Bilirubin: 0.3 mg/dL (ref 0.2–1.2)
Total Protein: 7.5 g/dL (ref 6.0–8.3)

## 2024-09-30 LAB — LIPID PANEL
Cholesterol: 181 mg/dL (ref 28–200)
HDL: 113.1 mg/dL (ref >39.00)
LDL Cholesterol: 60 mg/dL (ref 0–99)
NonHDL: 67.55
Total CHOL/HDL Ratio: 2
Triglycerides: 38 mg/dL (ref 0.0–149.0)
VLDL: 7.6 mg/dL (ref 0.0–40.0)

## 2024-09-30 LAB — CBC
HCT: 39.4 % (ref 36.0–46.0)
Hemoglobin: 13.4 g/dL (ref 12.0–15.0)
MCHC: 34 g/dL (ref 30.0–36.0)
MCV: 90.8 fl (ref 78.0–100.0)
Platelets: 293 K/uL (ref 150.0–400.0)
RBC: 4.34 Mil/uL (ref 3.87–5.11)
RDW: 12.8 % (ref 11.5–15.5)
WBC: 3.8 K/uL — ABNORMAL LOW (ref 4.0–10.5)

## 2024-09-30 LAB — VITAMIN B12: Vitamin B-12: 373 pg/mL (ref 211–911)

## 2024-09-30 LAB — VITAMIN D 25 HYDROXY (VIT D DEFICIENCY, FRACTURES): VITD: 37.56 ng/mL (ref 30.00–100.00)

## 2024-09-30 LAB — TSH: TSH: 0.71 u[IU]/mL (ref 0.35–5.50)

## 2024-09-30 NOTE — Assessment & Plan Note (Signed)
 Checking vitamin d  and b12. Adjust as needed.

## 2024-09-30 NOTE — Assessment & Plan Note (Signed)
 Referral to GI done for recurrent episode of rectal bleeding for colonoscopy.

## 2024-09-30 NOTE — Assessment & Plan Note (Signed)
 Refilled shampoo which is effective.

## 2024-09-30 NOTE — Assessment & Plan Note (Signed)
 Flu shot deferred today due to flu a positive. Tetanus up to date. Colonoscopy referral done for new rectal bleeding, pap smear up to date. Counseled about sun safety and mole surveillance. Counseled about the dangers of distracted driving. Given 10 year screening recommendations.

## 2024-09-30 NOTE — Assessment & Plan Note (Signed)
Checking TSH and adjust as needed.  

## 2024-09-30 NOTE — Assessment & Plan Note (Signed)
 Given endemic nature and symptoms less than 48 hours flu and covid-19 testing was done. Flu A was positive and rx for tamiflu  done with work note given. Discussed symptomatic treatment and isolation guidelines.

## 2024-11-04 ENCOUNTER — Encounter: Payer: Self-pay | Admitting: Physician Assistant

## 2024-12-03 ENCOUNTER — Ambulatory Visit: Admitting: Physician Assistant
